# Patient Record
Sex: Male | Born: 1970
Health system: Southern US, Community
[De-identification: ages and names within clinical notes are randomized; demographics above are authoritative.]

## PROBLEM LIST (undated history)

## (undated) DIAGNOSIS — Z87442 Personal history of urinary calculi: Secondary | ICD-10-CM

## (undated) DIAGNOSIS — E785 Hyperlipidemia, unspecified: Secondary | ICD-10-CM

## (undated) DIAGNOSIS — T7840XA Allergy, unspecified, initial encounter: Secondary | ICD-10-CM

## (undated) HISTORY — DX: Allergy, unspecified, initial encounter: T78.40XA

## (undated) HISTORY — PX: LITHOTRIPSY: SUR834

## (undated) HISTORY — DX: Hyperlipidemia, unspecified: E78.5

---

## 2012-12-03 ENCOUNTER — Ambulatory Visit (INDEPENDENT_AMBULATORY_CARE_PROVIDER_SITE_OTHER): Payer: BC Managed Care – PPO | Admitting: Family Medicine

## 2012-12-03 ENCOUNTER — Encounter: Payer: Self-pay | Admitting: Family Medicine

## 2012-12-03 VITALS — BP 127/81 | HR 64 | Temp 97.2°F | Ht 72.0 in | Wt 175.2 lb

## 2012-12-03 DIAGNOSIS — E785 Hyperlipidemia, unspecified: Secondary | ICD-10-CM

## 2012-12-03 DIAGNOSIS — J302 Other seasonal allergic rhinitis: Secondary | ICD-10-CM

## 2012-12-03 DIAGNOSIS — J309 Allergic rhinitis, unspecified: Secondary | ICD-10-CM

## 2012-12-03 DIAGNOSIS — Z23 Encounter for immunization: Secondary | ICD-10-CM

## 2012-12-03 MED ORDER — FLUTICASONE PROPIONATE 50 MCG/ACT NA SUSP: 2.0000 | NASAL | Status: DC | PRN

## 2012-12-03 MED ORDER — PRAVASTATIN SODIUM 40 MG PO TABS: 40.0000 mg | ORAL_TABLET | Freq: Every day | ORAL | Status: DC

## 2012-12-03 NOTE — Progress Notes (Signed)
Patient ID: Jose Golden, male   DOB: 1970/08/17, 42 y.o.   MRN: [id number] SUBJECTIVE: CC: Chief Complaint  Patient presents with  . Follow-up    6 month follow up  refill mail order print     HPI: Patient is here for follow up of hyperlipidemia: denies Headache;denies Chest Pain;denies weakness;denies Shortness of Breath and orthopnea;denies Visual changes;denies palpitations;denies cough;denies pedal edema;denies symptoms of TIA or stroke;deniesClaudication symptoms. admits to Compliance with medications; denies Problems with medications.  Breakfast: pop tarts, cereal bars, occasional bacon Lunch: sandwiches: bologna or ham, sometimes frozen meals Dinner: Fast food, occasional wife cooks. Loves french fries, occasional steak  Exercise: not a lot , mows lawn, works on [employer], broadcasting  Past Medical History  Diagnosis Date  . Allergy   . Hyperlipidemia    History reviewed. No pertinent past surgical history. History   Social History  . Marital Status: Married    Spouse Name: N/A    Number of Children: N/A  . Years of Education: N/A   Occupational History  . Not on file.   Social History Main Topics  . Smoking status: Never Smoker   . Smokeless tobacco: Not on file  . Alcohol Use: No  . Drug Use: No  . Sexually Active: Yes   Other Topics Concern  . Not on file   Social History Narrative  . No narrative on file   Family History  Problem Relation Age of Onset  . Heart disease Mother   . Cancer Mother     kidney  . Heart disease Father     CABG  . Seizures Father    No current outpatient prescriptions on file prior to visit.   No current facility-administered medications on file prior to visit.   No Known Allergies Immunization History  Administered Date(s) Administered  . Tdap 12/03/2012   Prior to Admission medications   Medication Sig Start Date End Date Taking? Authorizing Provider  cetirizine (ZYRTEC) 10 MG tablet Take 10 mg by mouth daily.   Yes  Historical Provider, MD  fluticasone (FLONASE) 50 MCG/ACT nasal spray Place 2 sprays into the nose as needed for rhinitis.   Yes Historical Provider, MD  pravastatin (PRAVACHOL) 40 MG tablet Take 40 mg by mouth daily.   Yes Historical Provider, MD    ROS: As above in the HPI. All other systems are stable or negative.  OBJECTIVE: APPEARANCE:  Patient in no acute distress.The patient appeared well nourished and normally developed. Acyanotic. Waist: VITAL SIGNS:BP 127/81  Pulse 64  Temp(Src) 97.2 F (36.2 C) (Oral)  Ht 6' (1.829 m)  Wt 175 lb 3.2 oz (79.47 kg)  BMI 23.76 kg/m2 WM  SKIN: warm and  Dry without overt rashes, tattoos and scars  HEAD and Neck: without JVD, Head and scalp: normal Eyes:No scleral icterus. Fundi normal, eye movements normal. Ears: Auricle normal, canal normal, Tympanic membranes normal, insufflation normal. Nose: normal Throat: normal Neck & thyroid: normal  CHEST & LUNGS: Chest wall: normal Lungs: Clear  CVS: Reveals the PMI to be normally located. Regular rhythm, First and Second Heart sounds are normal,  absence of murmurs, rubs or gallops. Peripheral vasculature: Radial pulses: normal Dorsal pedis pulses: normal Posterior pulses: normal  ABDOMEN:  Appearance: normal Benign, no organomegaly, no masses, no Abdominal Aortic enlargement. No Guarding , no rebound. No Bruits. Bowel sounds: normal  RECTAL: N/A GU: N/A  EXTREMETIES: nonedematous. Both Femoral and Pedal pulses are normal.  MUSCULOSKELETAL:  Spine: normal Joints: intact  NEUROLOGIC: oriented to time,place and person; nonfocal. Strength is normal Sensory is normal Reflexes are normal Cranial Nerves are normal.  ASSESSMENT: HLD (hyperlipidemia) - Plan: pravastatin (PRAVACHOL) 40 MG tablet, CMP14+EGFR, NMR, lipoprofile  Need for Tdap vaccination - Plan: Tdap vaccine greater than or equal to 7yo IM  Seasonal allergic rhinitis - Plan: fluticasone (FLONASE) 50 MCG/ACT  nasal spray   PLAN:      Dr Woodroe Mode Recommendations  Diet and Exercise discussed with patient.  For nutrition information, I recommend books:  1).Eat to Live by Dr Monico Hoar. 2).Prevent and Reverse Heart Disease by Dr Suzzette Righter. 3) Dr Katherina Right Book:  Program to Reverse Diabetes 4) The [place] Study by Florene Route  Exercise recommendations are:  If unable to walk, then the patient can exercise in a chair 3 times a day. By flapping arms like a bird gently and raising legs outwards to the front.  If ambulatory, the patient can go for walks for 30 minutes 3 times a week. Then increase the intensity and duration as tolerated.  Goal is to try to attain exercise frequency to 5 times a week.  If applicable: Best to perform resistance exercises (machines or weights) 2 days a week and cardio type exercises 3 days per week.  Discussed lifestyle changes.  Orders Placed This Encounter  Procedures  . Tdap vaccine greater than or equal to 7yo IM  . CMP14+EGFR  . NMR, lipoprofile    Meds ordered this encounter  Medications  . DISCONTD: pravastatin (PRAVACHOL) 40 MG tablet    Sig: Take 40 mg by mouth daily.  Marland Kitchen DISCONTD: fluticasone (FLONASE) 50 MCG/ACT nasal spray    Sig: Place 2 sprays into the nose as needed for rhinitis.  Marland Kitchen cetirizine (ZYRTEC) 10 MG tablet    Sig: Take 10 mg by mouth daily.  . pravastatin (PRAVACHOL) 40 MG tablet    Sig: Take 1 tablet (40 mg total) by mouth daily.    Dispense:  30 tablet    Refill:  11  . fluticasone (FLONASE) 50 MCG/ACT nasal spray    Sig: Place 2 sprays into the nose as needed for rhinitis.    Dispense:  16 g    Refill:  5   Return in about 6 months (around 06/05/2013) for Recheck medical problems.  Chavonne Sforza P. Modesto Charon, M.D.

## 2012-12-03 NOTE — Progress Notes (Signed)
Tolerated tdap injection well without difficulty.

## 2012-12-03 NOTE — Patient Instructions (Addendum)
Tetanus, Diphtheria, Pertussis (Tdap) Vaccine What You Need to Know WHY GET VACCINATED? Tetanus, diphtheria and pertussis can be very serious diseases, even for adolescents and adults. Tdap vaccine can protect us from these diseases. TETANUS (Lockjaw) causes painful muscle tightening and stiffness, usually all over the body.  It can lead to tightening of muscles in the head and neck so you can't open your mouth, swallow, or sometimes even breathe. Tetanus kills about 1 out of 5 people who are infected. DIPHTHERIA can cause a thick coating to form in the back of the throat.  It can lead to breathing problems, paralysis, heart failure, and death. PERTUSSIS (Whooping Cough) causes severe coughing spells, which can cause difficulty breathing, vomiting and disturbed sleep.  It can also lead to weight loss, incontinence, and rib fractures. Up to 2 in 100 adolescents and 5 in 100 adults with pertussis are hospitalized or have complications, which could include pneumonia and death. These diseases are caused by bacteria. Diphtheria and pertussis are spread from person to person through coughing or sneezing. Tetanus enters the body through cuts, scratches, or wounds. Before vaccines, the [place] saw as many as 200,000 cases a year of diphtheria and pertussis, and hundreds of cases of tetanus. Since vaccination began, tetanus and diphtheria have dropped by about 99% and pertussis by about 80%. TDAP VACCINE Tdap vaccine can protect adolescents and adults from tetanus, diphtheria, and pertussis. One dose of Tdap is routinely given at age 11 or 12. People who did not get Tdap at that age should get it as soon as possible. Tdap is especially important for health care professionals and anyone having close contact with a baby younger than 12 months. Pregnant women should get a dose of Tdap during every pregnancy, to protect the newborn from pertussis. Infants are most at risk for severe, life-threatening  complications from pertussis. A similar vaccine, called Td, protects from tetanus and diphtheria, but not pertussis. A Td booster should be given every 10 years. Tdap may be given as one of these boosters if you have not already gotten a dose. Tdap may also be given after a severe cut or burn to prevent tetanus infection. Your doctor can give you more information. Tdap may safely be given at the same time as other vaccines. SOME PEOPLE SHOULD NOT GET THIS VACCINE  If you ever had a life-threatening allergic reaction after a dose of any tetanus, diphtheria, or pertussis containing vaccine, OR if you have a severe allergy to any part of this vaccine, you should not get Tdap. Tell your doctor if you have any severe allergies.  If you had a coma, or long or multiple seizures within 7 days after a childhood dose of DTP or DTaP, you should not get Tdap, unless a cause other than the vaccine was found. You can still get Td.  Talk to your doctor if you:  have epilepsy or another nervous system problem,  had severe pain or swelling after any vaccine containing diphtheria, tetanus or pertussis,  ever had Guillain-Barr Syndrome (GBS),  aren't feeling well on the day the shot is scheduled. RISKS OF A VACCINE REACTION With any medicine, including vaccines, there is a chance of side effects. These are usually mild and go away on their own, but serious reactions are also possible. Brief fainting spells can follow a vaccination, leading to injuries from falling. Sitting or lying down for about 15 minutes can help prevent these. Tell your doctor if you feel dizzy or light-headed, or   have vision changes or ringing in the ears. Mild problems following Tdap (Did not interfere with activities)  Pain where the shot was given (about 3 in 4 adolescents or 2 in 3 adults)  Redness or swelling where the shot was given (about 1 person in 5)  Mild fever of at least 100.34F (up to about 1 in 25 adolescents or 1 in  100 adults)  Headache (about 3 or 4 people in 10)  Tiredness (about 1 person in 3 or 4)  Nausea, vomiting, diarrhea, stomach ache (up to 1 in 4 adolescents or 1 in 10 adults)  Chills, body aches, sore joints, rash, swollen glands (uncommon) Moderate problems following Tdap (Interfered with activities, but did not require medical attention)  Pain where the shot was given (about 1 in 5 adolescents or 1 in 100 adults)  Redness or swelling where the shot was given (up to about 1 in 16 adolescents or 1 in 25 adults)  Fever over 102F (about 1 in 100 adolescents or 1 in 250 adults)  Headache (about 3 in 20 adolescents or 1 in 10 adults)  Nausea, vomiting, diarrhea, stomach ache (up to 1 or 3 people in 100)  Swelling of the entire arm where the shot was given (up to about 3 in 100). Severe problems following Tdap (Unable to perform usual activities, required medical attention)  Swelling, severe pain, bleeding and redness in the arm where the shot was given (rare). A severe allergic reaction could occur after any vaccine (estimated less than 1 in a million doses). WHAT IF THERE IS A SERIOUS REACTION? What should I look for?  Look for anything that concerns you, such as signs of a severe allergic reaction, very high fever, or behavior changes. Signs of a severe allergic reaction can include hives, swelling of the face and throat, difficulty breathing, a fast heartbeat, dizziness, and weakness. These would start a few minutes to a few hours after the vaccination. What should I do?  If you think it is a severe allergic reaction or other emergency that can't wait, call 9-1-1 or get the person to the nearest hospital. Otherwise, call your doctor.  Afterward, the reaction should be reported to the "Vaccine Adverse Event Reporting System" (VAERS). Your doctor might file this report, or you can do it yourself through the VAERS web site at www.vaers.LAgents.no, or by calling 1-(561)626-9151. VAERS is  only for reporting reactions. They do not give medical advice.  THE NATIONAL VACCINE INJURY COMPENSATION PROGRAM The National Vaccine Injury Compensation Program (VICP) is a federal program that was created to compensate people who may have been injured by certain vaccines. Persons who believe they may have been injured by a vaccine can learn about the program and about filing a claim by calling 1-661-229-6440 or visiting the VICP website at SpiritualWord.at. HOW CAN I LEARN MORE?  Ask your doctor.  Call your local or state health department.  Contact the Centers for Disease Control and Prevention (CDC):  Call 604 738 1426 or visit CDC's website at PicCapture.uy. CDC Tdap Vaccine VIS (09/14/11) Document Released: 10/24/2011 Document Revised: 01/17/2012 Document Reviewed: 10/24/2011 ExitCare Patient Information 2014 [place], [place].        Dr Woodroe Mode Recommendations  Diet and Exercise discussed with patient.  For nutrition information, I recommend books:  1).Eat to Live by Dr Monico Hoar. 2).Prevent and Reverse Heart Disease by Dr Suzzette Righter. 3) Dr Katherina Right Book:  Program to Reverse Diabetes 4) The [place]  Study by T Ferol Luz  Exercise recommendations are:  If unable to walk, then the patient can exercise in a chair 3 times a day. By flapping arms like a bird gently and raising legs outwards to the front.  If ambulatory, the patient can go for walks for 30 minutes 3 times a week. Then increase the intensity and duration as tolerated.  Goal is to try to attain exercise frequency to 5 times a week.  If applicable: Best to perform resistance exercises (machines or weights) 2 days a week and cardio type exercises 3 days per week.

## 2012-12-04 LAB — CMP14+EGFR
ALT: 22 IU/L (ref 0–44)
AST: 25 IU/L (ref 0–40)
Albumin/Globulin Ratio: 2 (ref 1.1–2.5)
Albumin: 4.5 g/dL (ref 3.5–5.5)
Alkaline Phosphatase: 51 IU/L (ref 39–117)
BUN/Creatinine Ratio: 11 (ref 9–20)
BUN: 10 mg/dL (ref 6–24)
CO2: 23 mmol/L (ref 18–29)
Calcium: 9.5 mg/dL (ref 8.7–10.2)
Chloride: 105 mmol/L (ref 97–108)
Creatinine, Ser: 0.92 mg/dL (ref 0.76–1.27)
GFR calc Af Amer: 117 mL/min/{1.73_m2} (ref >59)
GFR calc non Af Amer: 102 mL/min/{1.73_m2} (ref >59)
Globulin, Total: 2.3 g/dL (ref 1.5–4.5)
Glucose: 88 mg/dL (ref 65–99)
Potassium: 4.7 mmol/L (ref 3.5–5.2)
Sodium: 142 mmol/L (ref 134–144)
Total Bilirubin: 1.2 mg/dL (ref 0.0–1.2)
Total Protein: 6.8 g/dL (ref 6.0–8.5)

## 2012-12-04 LAB — NMR, LIPOPROFILE
Cholesterol: 131 mg/dL (ref <200)
HDL Cholesterol by NMR: 51 mg/dL (ref >=40)
HDL Particle Number: 35.5 umol/L (ref >=30.5)
LDL Particle Number: 787 nmol/L (ref <1000)
LDL Size: 20.6 nm (ref >20.5)
LDLC SERPL CALC-MCNC: 69 mg/dL (ref <100)
LP-IR Score: 38 (ref <=45)
Small LDL Particle Number: 437 nmol/L (ref <=527)
Triglycerides by NMR: 57 mg/dL (ref <150)

## 2012-12-04 NOTE — Progress Notes (Signed)
Quick Note:  Lab result at goal. No change in Medications for now. No Change in plans and follow up. ______ 

## 2012-12-12 ENCOUNTER — Encounter: Payer: Self-pay | Admitting: *Deleted

## 2013-01-21 ENCOUNTER — Other Ambulatory Visit: Payer: Self-pay

## 2013-01-21 DIAGNOSIS — E785 Hyperlipidemia, unspecified: Secondary | ICD-10-CM

## 2013-01-21 MED ORDER — PRAVASTATIN SODIUM 40 MG PO TABS: 40.0000 mg | ORAL_TABLET | Freq: Every day | ORAL | Status: DC

## 2013-01-21 NOTE — Telephone Encounter (Signed)
Filled 12/03/12 for 11 refills but wants for mail order now   If approved print and route to nurse

## 2013-06-05 ENCOUNTER — Encounter: Payer: Self-pay | Admitting: Family Medicine

## 2013-06-05 ENCOUNTER — Ambulatory Visit (INDEPENDENT_AMBULATORY_CARE_PROVIDER_SITE_OTHER): Payer: BC Managed Care – PPO | Admitting: Family Medicine

## 2013-06-05 VITALS — BP 126/82 | HR 73 | Temp 97.3°F | Ht 72.0 in | Wt 177.4 lb

## 2013-06-05 DIAGNOSIS — J302 Other seasonal allergic rhinitis: Secondary | ICD-10-CM

## 2013-06-05 DIAGNOSIS — E785 Hyperlipidemia, unspecified: Secondary | ICD-10-CM

## 2013-06-05 DIAGNOSIS — J309 Allergic rhinitis, unspecified: Secondary | ICD-10-CM

## 2013-06-05 MED ORDER — PRAVASTATIN SODIUM 40 MG PO TABS: 40.0000 mg | ORAL_TABLET | Freq: Every day | ORAL | Status: DC

## 2013-06-05 NOTE — Patient Instructions (Signed)
      Dr Junette Bernat's Recommendations  For nutrition information, I recommend books:  1).Eat to Live by Dr Joel Fuhrman. 2).Prevent and Reverse Heart Disease by Dr Caldwell Esselstyn. 3) Dr Neal Barnard's Book:  Program to Reverse Diabetes  Exercise recommendations are:  If unable to walk, then the patient can exercise in a chair 3 times a day. By flapping arms like a bird gently and raising legs outwards to the front.  If ambulatory, the patient can go for walks for 30 minutes 3 times a week. Then increase the intensity and duration as tolerated.  Goal is to try to attain exercise frequency to 5 times a week.  If applicable: Best to perform resistance exercises (machines or weights) 2 days a week and cardio type exercises 3 days per week.  

## 2013-06-05 NOTE — Progress Notes (Signed)
Patient ID: RICHARDS PHERIGO, male   DOB: [birth year], 43 y.o.   MRN: [id number] SUBJECTIVE: CC: Chief Complaint  Patient presents with  . Follow-up    6 month follow up chronic problems     HPI: Patient is here for follow up of hyperlipidemia: denies Headache;denies Chest Pain;denies weakness;denies Shortness of Breath and orthopnea;denies Visual changes;denies palpitations;denies cough;denies pedal edema;denies symptoms of TIA or stroke;deniesClaudication symptoms. admits to Compliance with medications; denies Problems with medications.   Past Medical History  Diagnosis Date  . Allergy   . Hyperlipidemia    No past surgical history on file. History   Social History  . Marital Status: Married    Spouse Name: N/A    Number of Children: N/A  . Years of Education: N/A   Occupational History  . Not on file.   Social History Main Topics  . Smoking status: Never Smoker   . Smokeless tobacco: Not on file  . Alcohol Use: No  . Drug Use: No  . Sexual Activity: Yes   Other Topics Concern  . Not on file   Social History Narrative  . No narrative on file   Family History  Problem Relation Age of Onset  . Heart disease Mother   . Cancer Mother     kidney  . Heart disease Father     CABG  . Seizures Father    Current Outpatient Prescriptions on File Prior to Visit  Medication Sig Dispense Refill  . cetirizine (ZYRTEC) 10 MG tablet Take 10 mg by mouth daily.      . fluticasone (FLONASE) 50 MCG/ACT nasal spray Place 2 sprays into the nose as needed for rhinitis.  16 g  5   No current facility-administered medications on file prior to visit.   No Known Allergies Immunization History  Administered Date(s) Administered  . Influenza,inj,Quad PF,36+ Mos 03/05/2013  . Tdap 12/03/2012   Prior to Admission medications   Medication Sig Start Date End Date Taking? Authorizing Provider  cetirizine (ZYRTEC) 10 MG tablet Take 10 mg by mouth daily.    Historical Provider, MD   fluticasone (FLONASE) 50 MCG/ACT nasal spray Place 2 sprays into the nose as needed for rhinitis. 12/03/12   Vernie Shanks, MD  pravastatin (PRAVACHOL) 40 MG tablet Take 1 tablet (40 mg total) by mouth daily. 01/21/13   Mary-Margaret Hassell Done, FNP     ROS: As above in the HPI. All other systems are stable or negative.  OBJECTIVE: APPEARANCE:  Patient in no acute distress.The patient appeared well nourished and normally developed. Acyanotic. Waist: VITAL SIGNS:BP 126/82  Pulse 73  Temp(Src) 97.3 F (36.3 C) (Oral)  Ht 6' (1.829 m)  Wt 177 lb 6.4 oz (80.468 kg)  BMI 24.05 kg/m2 WM  SKIN: warm and  Dry without overt rashes, tattoos and scars  HEAD and Neck: without JVD, Head and scalp: normal Eyes:No scleral icterus. Fundi normal, eye movements normal. Ears: Auricle normal, canal normal, Tympanic membranes normal, insufflation normal. Nose: normal Throat: normal Neck & thyroid: normal  CHEST & LUNGS: Chest wall: normal Lungs: Clear  CVS: Reveals the PMI to be normally located. Regular rhythm, First and Second Heart sounds are normal,  absence of murmurs, rubs or gallops. Peripheral vasculature: Radial pulses: normal Dorsal pedis pulses: normal Posterior pulses: normal  ABDOMEN:  Appearance: normal Benign, no organomegaly, no masses, no Abdominal Aortic enlargement. No Guarding , no rebound. No Bruits. Bowel sounds: normal  RECTAL: N/A GU: N/A  EXTREMETIES: nonedematous.  MUSCULOSKELETAL:  Spine: normal Joints: intact  NEUROLOGIC: oriented to time,place and person; nonfocal. Strength is normal Sensory is normal Reflexes are normal Cranial Nerves are normal. Results for orders placed in visit on 12/03/12  CMP14+EGFR      Result Value Range   Glucose 88  65 - 99 mg/dL   BUN 10  6 - 24 mg/dL   Creatinine, Ser 0.92  0.76 - 1.27 mg/dL   GFR calc non Af Amer 102  >59 mL/min/1.73   GFR calc Af Amer 117  >59 mL/min/1.73   BUN/Creatinine Ratio 11  9 - 20    Sodium 142  134 - 144 mmol/L   Potassium 4.7  3.5 - 5.2 mmol/L   Chloride 105  97 - 108 mmol/L   CO2 23  18 - 29 mmol/L   Calcium 9.5  8.7 - 10.2 mg/dL   Total Protein 6.8  6.0 - 8.5 g/dL   Albumin 4.5  3.5 - 5.5 g/dL   Globulin, Total 2.3  1.5 - 4.5 g/dL   Albumin/Globulin Ratio 2.0  1.1 - 2.5   Total Bilirubin 1.2  0.0 - 1.2 mg/dL   Alkaline Phosphatase 51  39 - 117 IU/L   AST 25  0 - 40 IU/L   ALT 22  0 - 44 IU/L  NMR, LIPOPROFILE      Result Value Range   LDL Particle Number [id number]  <1000 nmol/L   LDLC SERPL CALC-MCNC 69  <100 mg/dL   HDL Cholesterol by NMR 51  >=40 mg/dL   Triglycerides by NMR 57  <150 mg/dL   Cholesterol 131  <200 mg/dL   HDL Particle Number 35.5  >=30.5 umol/L   Small LDL Particle Number 437  <=527 nmol/L   LDL Size 20.6  >20.5 nm   LP-IR Score 38  <=45    ASSESSMENT: Seasonal allergic rhinitis  HLD (hyperlipidemia) - Plan: CMP14+EGFR, Lipid panel, pravastatin (PRAVACHOL) 40 MG tablet  PLAN:      Dr Paula Libra Recommendations  For nutrition information, I recommend books:  1).Eat to Live by Dr Excell Seltzer. 2).Prevent and Reverse Heart Disease by Dr Karl Luke. 3) Dr Janene Harvey Book:  Program to Reverse Diabetes  Exercise recommendations are:  If unable to walk, then the patient can exercise in a chair 3 times a day. By flapping arms like a bird gently and raising legs outwards to the front.  If ambulatory, the patient can go for walks for 30 minutes 3 times a week. Then increase the intensity and duration as tolerated.  Goal is to try to attain exercise frequency to 5 times a week.  If applicable: Best to perform resistance exercises (machines or weights) 2 days a week and cardio type exercises 3 days per week.  Orders Placed This Encounter  Procedures  . CMP14+EGFR  . Lipid panel   Meds ordered this encounter  Medications  . pravastatin (PRAVACHOL) 40 MG tablet    Sig: Take 1 tablet (40 mg total) by mouth daily.     Dispense:  90 tablet    Refill:  3   Medications Discontinued During This Encounter  Medication Reason  . pravastatin (PRAVACHOL) 40 MG tablet Reorder   Return in about 6 months (around 12/03/2013) for Recheck medical problems.  Marikay Roads P. Jacelyn Grip, M.D.

## 2013-06-06 LAB — CMP14+EGFR
ALT: 33 IU/L (ref 0–44)
AST: 29 IU/L (ref 0–40)
Albumin/Globulin Ratio: 2.2 (ref 1.1–2.5)
Albumin: 4.7 g/dL (ref 3.5–5.5)
Alkaline Phosphatase: 59 IU/L (ref 39–117)
BUN/Creatinine Ratio: 11 (ref 9–20)
BUN: 12 mg/dL (ref 6–24)
CO2: 24 mmol/L (ref 18–29)
Calcium: 9.5 mg/dL (ref 8.7–10.2)
Chloride: 102 mmol/L (ref 97–108)
Creatinine, Ser: 1.08 mg/dL (ref 0.76–1.27)
GFR calc Af Amer: 97 mL/min/{1.73_m2} (ref >59)
GFR calc non Af Amer: 84 mL/min/{1.73_m2} (ref >59)
Globulin, Total: 2.1 g/dL (ref 1.5–4.5)
Glucose: 83 mg/dL (ref 65–99)
Potassium: 4.7 mmol/L (ref 3.5–5.2)
Sodium: 142 mmol/L (ref 134–144)
Total Bilirubin: 1.9 mg/dL — ABNORMAL HIGH (ref 0.0–1.2)
Total Protein: 6.8 g/dL (ref 6.0–8.5)

## 2013-06-06 LAB — LIPID PANEL
Chol/HDL Ratio: 2.5 ratio units (ref 0.0–5.0)
Cholesterol, Total: 147 mg/dL (ref 100–199)
HDL: 60 mg/dL (ref >39)
LDL Calculated: 75 mg/dL (ref 0–99)
Triglycerides: 59 mg/dL (ref 0–149)
VLDL Cholesterol Cal: 12 mg/dL (ref 5–40)

## 2013-07-01 ENCOUNTER — Telehealth: Payer: Self-pay | Admitting: Family Medicine

## 2013-07-01 ENCOUNTER — Ambulatory Visit (INDEPENDENT_AMBULATORY_CARE_PROVIDER_SITE_OTHER): Payer: BC Managed Care – PPO | Admitting: Family Medicine

## 2013-07-01 ENCOUNTER — Encounter: Payer: Self-pay | Admitting: Family Medicine

## 2013-07-01 ENCOUNTER — Encounter: Payer: Self-pay | Admitting: *Deleted

## 2013-07-01 VITALS — BP 144/86 | HR 110 | Temp 99.9°F | Ht 72.0 in | Wt 180.0 lb

## 2013-07-01 DIAGNOSIS — R059 Cough, unspecified: Secondary | ICD-10-CM

## 2013-07-01 DIAGNOSIS — J029 Acute pharyngitis, unspecified: Secondary | ICD-10-CM

## 2013-07-01 DIAGNOSIS — J111 Influenza due to unidentified influenza virus with other respiratory manifestations: Secondary | ICD-10-CM

## 2013-07-01 DIAGNOSIS — R6889 Other general symptoms and signs: Secondary | ICD-10-CM

## 2013-07-01 DIAGNOSIS — R05 Cough: Secondary | ICD-10-CM

## 2013-07-01 LAB — POCT INFLUENZA A/B
Influenza A, POC: POSITIVE
Influenza B, POC: NEGATIVE

## 2013-07-01 LAB — POCT RAPID STREP A (OFFICE): Rapid Strep A Screen: NEGATIVE

## 2013-07-01 MED ORDER — AZITHROMYCIN 250 MG PO TABS: ORAL_TABLET | ORAL | Status: DC

## 2013-07-01 MED ORDER — OSELTAMIVIR PHOSPHATE 75 MG PO CAPS: 75.0000 mg | ORAL_CAPSULE | Freq: Two times a day (BID) | ORAL | Status: DC

## 2013-07-01 NOTE — Telephone Encounter (Signed)
Patient wife aware

## 2013-07-01 NOTE — Progress Notes (Signed)
   Subjective:    Patient ID: Jose Golden, male    DOB: 06/10/1970, 43 y.o.   MRN: [id number]  HPI This 43 y.o. male presents for evaluation of fever, uri sx's, fever, malaise, sore throat, and fatigue.  Review of Systems    No chest pain, SOB, HA, dizziness, vision change, N/V, diarrhea, constipation, dysuria, urinary urgency or frequency, myalgias, arthralgias or rash.  Objective:   Physical Exam  Vital signs noted  Well developed well nourished male.  HEENT - Head atraumatic Normocephalic                Eyes - PERRLA, Conjuctiva - clear Sclera- Clear EOMI                Ears - EAC's Wnl TM's Wnl Gross Hearing WNL                Nose - Nares patent                 Throat - oropharanx wnl Respiratory - Lungs CTA bilateral Cardiac - RRR S1 and S2 without murmur GI - Abdomen soft Nontender and bowel sounds active x 4 Extremities - No edema. Neuro - Grossly intact.      Assessment & Plan:  Flu-like symptoms - Plan: POCT Influenza A/B, POCT rapid strep A, oseltamivir (TAMIFLU) 75 MG capsule, azithromycin (ZITHROMAX) 250 MG tablet  Flu - Plan: oseltamivir (TAMIFLU) 75 MG capsule, azithromycin (ZITHROMAX) 250 MG tablet  Push po fluids, rest, tylenol and motrin otc prn as directed for fever, arthralgias, and myalgias.  Follow up prn if sx's continue or persist.  Deatra CanterWilliam J Martia Dalby FNP

## 2013-07-01 NOTE — Telephone Encounter (Signed)
Has the wife and daughter been seen here before?  It doesn't look like the daughter has been seen here

## 2013-10-03 ENCOUNTER — Other Ambulatory Visit: Payer: Self-pay | Admitting: Nurse Practitioner

## 2013-10-03 NOTE — Telephone Encounter (Signed)
Last seen 07/01/13 B [place] Last lipid 06/05/13  Requesting a 90 day supply

## 2013-12-12 ENCOUNTER — Other Ambulatory Visit: Payer: Self-pay | Admitting: Family Medicine

## 2014-03-12 ENCOUNTER — Other Ambulatory Visit: Payer: Self-pay | Admitting: Family Medicine

## 2016-07-20 ENCOUNTER — Encounter: Payer: Self-pay | Admitting: Family

## 2016-07-20 ENCOUNTER — Ambulatory Visit (INDEPENDENT_AMBULATORY_CARE_PROVIDER_SITE_OTHER): Payer: BLUE CROSS/BLUE SHIELD | Admitting: Family

## 2016-07-20 VITALS — BP 145/89 | HR 90 | Temp 98.1°F | Ht 72.0 in | Wt 187.4 lb

## 2016-07-20 DIAGNOSIS — R21 Rash and other nonspecific skin eruption: Secondary | ICD-10-CM | POA: Diagnosis not present

## 2016-07-20 DIAGNOSIS — B09 Unspecified viral infection characterized by skin and mucous membrane lesions: Secondary | ICD-10-CM | POA: Diagnosis not present

## 2016-07-20 NOTE — Patient Instructions (Signed)
Viral Illness, Adult Viruses are tiny germs that can get into a person's body and cause illness. There are many different types of viruses, and they cause many types of illness. Viral illnesses can range from mild to severe. They can affect various parts of the body. Common illnesses that are caused by a virus include colds and the flu. Viral illnesses also include serious conditions such as HIV/AIDS (human immunodeficiency virus/acquired immunodeficiency syndrome). A few viruses have been linked to certain cancers. What are the causes? Many types of viruses can cause illness. Viruses invade cells in your body, multiply, and cause the infected cells to malfunction or die. When the cell dies, it releases more of the virus. When this happens, you develop symptoms of the illness, and the virus continues to spread to other cells. If the virus takes over the function of the cell, it can cause the cell to divide and grow out of control, as is the case when a virus causes cancer. Different viruses get into the body in different ways. You can get a virus by:  Swallowing food or water that is contaminated with the virus.  Breathing in droplets that have been coughed or sneezed into the air by an infected person.  Touching a surface that has been contaminated with the virus and then touching your eyes, nose, or mouth.  Being bitten by an insect or animal that carries the virus.  Having sexual contact with a person who is infected with the virus.  Being exposed to blood or fluids that contain the virus, either through an open cut or during a transfusion. If a virus enters your body, your body's defense system (immune system) will try to fight the virus. You may be at higher risk for a viral illness if your immune system is weak. What are the signs or symptoms? Symptoms vary depending on the type of virus and the location of the cells that it invades. Common symptoms of the main types of viral illnesses  include: Cold and flu viruses   Fever.  Headache.  Sore throat.  Muscle aches.  Nasal congestion.  Cough. Digestive system (gastrointestinal) viruses   Fever.  Abdominal pain.  Nausea.  Diarrhea. Liver viruses (hepatitis)   Loss of appetite.  Tiredness.  Yellowing of the skin (jaundice). Brain and spinal cord viruses   Fever.  Headache.  Stiff neck.  Nausea and vomiting.  Confusion or sleepiness. Skin viruses   Warts.  Itching.  Rash. Sexually transmitted viruses   Discharge.  Swelling.  Redness.  Rash. How is this treated? Viruses can be difficult to treat because they live within cells. Antibiotic medicines do not treat viruses because these drugs do not get inside cells. Treatment for a viral illness may include:  Resting and drinking plenty of fluids.  Medicines to relieve symptoms. These can include over-the-counter medicine for pain and fever, medicines for cough or congestion, and medicines to relieve diarrhea.  Antiviral medicines. These drugs are available only for certain types of viruses. They may help reduce flu symptoms if taken early. There are also many antiviral medicines for hepatitis and HIV/AIDS. Some viral illnesses can be prevented with vaccinations. A common example is the flu shot. Follow these instructions at home: Medicines    Take over-the-counter and prescription medicines only as told by your health care provider.  If you were prescribed an antiviral medicine, take it as told by your health care provider. Do not stop taking the medicine even if you start to   feel better.  Be aware of when antibiotics are needed and when they are not needed. Antibiotics do not treat viruses. If your health care provider thinks that you may have a bacterial infection as well as a viral infection, you may get an antibiotic.  Do not ask for an antibiotic prescription if you have been diagnosed with a viral illness. That will not make  your illness go away faster.  Frequently taking antibiotics when they are not needed can lead to antibiotic resistance. When this develops, the medicine no longer works against the bacteria that it normally fights. General instructions   Drink enough fluids to keep your urine clear or pale yellow.  Rest as much as possible.  Return to your normal activities as told by your health care provider. Ask your health care provider what activities are safe for you.  Keep all follow-up visits as told by your health care provider. This is important. How is this prevented? Take these actions to reduce your risk of viral infection:  Eat a healthy diet and get enough rest.  Wash your hands often with soap and water. This is especially important when you are in public places. If soap and water are not available, use hand sanitizer.  Avoid close contact with friends and family who have a viral illness.  If you travel to areas where viral gastrointestinal infection is common, avoid drinking water or eating raw food.  Keep your immunizations up to date. Get a flu shot every year as told by your health care provider.  Do not share toothbrushes, nail clippers, razors, or needles with other people.  Always practice safe sex. Contact a health care provider if:  You have symptoms of a viral illness that do not go away.  Your symptoms come back after going away.  Your symptoms get worse. Get help right away if:  You have trouble breathing.  You have a severe headache or a stiff neck.  You have severe vomiting or abdominal pain. This information is not intended to replace advice given to you by your health care provider. Make sure you discuss any questions you have with your health care provider. Document Released: 09/03/2015 Document Revised: 10/06/2015 Document Reviewed: 09/03/2015 Elsevier Interactive Patient Education  2017 [employer]  

## 2016-07-20 NOTE — Progress Notes (Signed)
   Subjective:    Patient ID: Jose Golden, male    DOB: 12/28/1970, 46 y.o.   MRN: [id number]  Rash  This is a new problem. The current episode started yesterday. The problem is unchanged. The affected locations include the abdomen, back, chest, left arm and right arm. The rash is characterized by redness. It is unknown if there was an exposure to a precipitant. Pertinent negatives include no congestion, cough, diarrhea, joint pain, shortness of breath, sore throat or vomiting. Past treatments include antihistamine. The treatment provided mild relief.      Review of Systems  HENT: Negative for congestion and sore throat.   Respiratory: Negative for cough and shortness of breath.   Gastrointestinal: Negative for diarrhea and vomiting.  Musculoskeletal: Negative for joint pain.  Skin: Positive for rash.  All other systems reviewed and are negative.      Objective:   Physical Exam  Constitutional: He is oriented to person, place, and time. He appears well-developed and well-nourished. No distress.  HENT:  Head: Normocephalic.  Eyes: Pupils are equal, round, and reactive to light. Right eye exhibits no discharge. Left eye exhibits no discharge.  Neck: Normal range of motion. Neck supple. No thyromegaly present.  Cardiovascular: Normal rate, regular rhythm, normal heart sounds and intact distal pulses.   No murmur heard. Pulmonary/Chest: Effort normal and breath sounds normal. No respiratory distress. He has no wheezes.  Abdominal: Soft. Bowel sounds are normal. He exhibits no distension. There is no tenderness.  Musculoskeletal: Normal range of motion. He exhibits no edema or tenderness.  Neurological: He is alert and oriented to person, place, and time.  Skin: Skin is warm and dry. Rash noted. No erythema.  Generalized erythemas blanchable macule rash   Psychiatric: He has a normal mood and affect. His behavior is normal. Judgment and thought content normal.  Vitals  reviewed.     BP (!) 145/89   Pulse 90   Temp 98.1 F (36.7 C) (Oral)   Ht 6' (1.829 m)   Wt 187 lb 6.4 oz (85 kg)   BMI 25.42 kg/m      Assessment & Plan:  1. Rash  2. Viral rash  Force fluids Continue Zyrtec daily Benadryl as needed Call office if rash worsens RTO prn   Jannifer Rodneyhristy Eurydice Calixto, FNP

## 2016-08-21 DIAGNOSIS — E785 Hyperlipidemia, unspecified: Secondary | ICD-10-CM | POA: Diagnosis not present

## 2016-08-21 DIAGNOSIS — Z79899 Other long term (current) drug therapy: Secondary | ICD-10-CM | POA: Diagnosis not present

## 2016-08-21 DIAGNOSIS — E559 Vitamin D deficiency, unspecified: Secondary | ICD-10-CM | POA: Diagnosis not present

## 2016-08-21 DIAGNOSIS — Z008 Encounter for other general examination: Secondary | ICD-10-CM | POA: Diagnosis not present

## 2016-08-21 DIAGNOSIS — Z719 Counseling, unspecified: Secondary | ICD-10-CM | POA: Diagnosis not present

## 2016-08-28 DIAGNOSIS — E785 Hyperlipidemia, unspecified: Secondary | ICD-10-CM | POA: Diagnosis not present

## 2017-01-24 DIAGNOSIS — E785 Hyperlipidemia, unspecified: Secondary | ICD-10-CM | POA: Diagnosis not present

## 2017-01-24 DIAGNOSIS — E559 Vitamin D deficiency, unspecified: Secondary | ICD-10-CM | POA: Diagnosis not present

## 2017-01-24 DIAGNOSIS — Z79899 Other long term (current) drug therapy: Secondary | ICD-10-CM | POA: Diagnosis not present

## 2017-01-24 DIAGNOSIS — Z139 Encounter for screening, unspecified: Secondary | ICD-10-CM | POA: Diagnosis not present

## 2017-02-07 DIAGNOSIS — E785 Hyperlipidemia, unspecified: Secondary | ICD-10-CM | POA: Diagnosis not present

## 2017-04-26 DIAGNOSIS — L509 Urticaria, unspecified: Secondary | ICD-10-CM | POA: Diagnosis not present

## 2017-04-26 DIAGNOSIS — R21 Rash and other nonspecific skin eruption: Secondary | ICD-10-CM | POA: Diagnosis not present

## 2017-04-26 DIAGNOSIS — Z6824 Body mass index (BMI) 24.0-24.9, adult: Secondary | ICD-10-CM | POA: Diagnosis not present

## 2017-05-18 DIAGNOSIS — J329 Chronic sinusitis, unspecified: Secondary | ICD-10-CM | POA: Diagnosis not present

## 2017-08-08 DIAGNOSIS — Z79899 Other long term (current) drug therapy: Secondary | ICD-10-CM | POA: Diagnosis not present

## 2017-08-08 DIAGNOSIS — E559 Vitamin D deficiency, unspecified: Secondary | ICD-10-CM | POA: Diagnosis not present

## 2017-08-08 DIAGNOSIS — Z713 Dietary counseling and surveillance: Secondary | ICD-10-CM | POA: Diagnosis not present

## 2017-08-08 DIAGNOSIS — Z008 Encounter for other general examination: Secondary | ICD-10-CM | POA: Diagnosis not present

## 2017-08-08 DIAGNOSIS — E785 Hyperlipidemia, unspecified: Secondary | ICD-10-CM | POA: Diagnosis not present

## 2017-08-15 DIAGNOSIS — E785 Hyperlipidemia, unspecified: Secondary | ICD-10-CM | POA: Diagnosis not present

## 2018-01-21 ENCOUNTER — Encounter: Payer: Self-pay | Admitting: Family

## 2018-01-21 DIAGNOSIS — E559 Vitamin D deficiency, unspecified: Secondary | ICD-10-CM | POA: Diagnosis not present

## 2018-01-21 DIAGNOSIS — Z0130 Encounter for examination of blood pressure without abnormal findings: Secondary | ICD-10-CM | POA: Diagnosis not present

## 2018-01-21 DIAGNOSIS — Z79899 Other long term (current) drug therapy: Secondary | ICD-10-CM | POA: Diagnosis not present

## 2018-01-21 DIAGNOSIS — Z139 Encounter for screening, unspecified: Secondary | ICD-10-CM | POA: Diagnosis not present

## 2018-01-21 DIAGNOSIS — E785 Hyperlipidemia, unspecified: Secondary | ICD-10-CM | POA: Diagnosis not present

## 2018-01-31 DIAGNOSIS — Z23 Encounter for immunization: Secondary | ICD-10-CM | POA: Diagnosis not present

## 2018-02-04 DIAGNOSIS — R945 Abnormal results of liver function studies: Secondary | ICD-10-CM | POA: Diagnosis not present

## 2018-02-04 DIAGNOSIS — E785 Hyperlipidemia, unspecified: Secondary | ICD-10-CM | POA: Diagnosis not present

## 2018-03-14 ENCOUNTER — Ambulatory Visit (INDEPENDENT_AMBULATORY_CARE_PROVIDER_SITE_OTHER): Payer: BLUE CROSS/BLUE SHIELD

## 2018-03-14 ENCOUNTER — Ambulatory Visit (INDEPENDENT_AMBULATORY_CARE_PROVIDER_SITE_OTHER): Payer: BLUE CROSS/BLUE SHIELD | Admitting: Family

## 2018-03-14 ENCOUNTER — Encounter: Payer: Self-pay | Admitting: Family

## 2018-03-14 VITALS — BP 153/97 | HR 83 | Temp 97.3°F | Ht 72.0 in | Wt 191.0 lb

## 2018-03-14 DIAGNOSIS — Z0000 Encounter for general adult medical examination without abnormal findings: Secondary | ICD-10-CM | POA: Diagnosis not present

## 2018-03-14 DIAGNOSIS — K5900 Constipation, unspecified: Secondary | ICD-10-CM

## 2018-03-14 DIAGNOSIS — R1032 Left lower quadrant pain: Secondary | ICD-10-CM

## 2018-03-14 DIAGNOSIS — N200 Calculus of kidney: Secondary | ICD-10-CM | POA: Diagnosis not present

## 2018-03-14 DIAGNOSIS — E785 Hyperlipidemia, unspecified: Secondary | ICD-10-CM

## 2018-03-14 DIAGNOSIS — R030 Elevated blood-pressure reading, without diagnosis of hypertension: Secondary | ICD-10-CM

## 2018-03-14 DIAGNOSIS — E663 Overweight: Secondary | ICD-10-CM

## 2018-03-14 MED ORDER — POLYETHYLENE GLYCOL 3350 17 GM/SCOOP PO POWD: 17.0000 g | Freq: Two times a day (BID) | ORAL | 1 refills | Status: DC | PRN

## 2018-03-14 NOTE — Patient Instructions (Signed)

## 2018-03-14 NOTE — Progress Notes (Signed)
Subjective:    Patient ID: Jose Golden, male    DOB: 15-Aug-1970, 47 y.o.   MRN: [id number]  Chief Complaint  Patient presents with  . Annual Exam    pressure lower right abdomen   Pt presents to the office today for CPE. He had lab work drawn at [employer] on 01/21/18. A copy is scanned into his chart.  Hyperlipidemia  This is a chronic problem. The current episode started more than 1 year ago. The problem is controlled. Recent lipid tests were reviewed and are normal. Current antihyperlipidemic treatment includes statins. The current treatment provides moderate improvement of lipids. Risk factors for coronary artery disease include dyslipidemia, family history and male sex.  Abdominal Pain  This is a new problem. The current episode started more than 1 month ago. The onset quality is gradual. The problem occurs intermittently. The problem has been gradually worsening. The pain is located in the LLQ. The pain is at a severity of 1/10. The pain is mild. The quality of the pain is dull. The abdominal pain does not radiate. Associated symptoms include diarrhea. Pertinent negatives include no belching, constipation, dysuria, hematochezia, hematuria, nausea or vomiting. The pain is aggravated by certain positions. The pain is relieved by nothing. He has tried nothing for the symptoms. The treatment provided no relief.      Review of Systems  Gastrointestinal: Positive for abdominal pain and diarrhea. Negative for constipation, hematochezia, nausea and vomiting.  Genitourinary: Negative for dysuria and hematuria.  All other systems reviewed and are negative.      Objective:   Physical Exam  Constitutional: He is oriented to person, place, and time. He appears well-developed and well-nourished. No distress.  HENT:  Head: Normocephalic.  Right Ear: External ear normal.  Left Ear: External ear normal.  Mouth/Throat: Oropharynx is clear and moist.  Eyes: Pupils are equal, round, and reactive to  light. Right eye exhibits no discharge. Left eye exhibits no discharge.  Neck: Normal range of motion. Neck supple. No thyromegaly present.  Cardiovascular: Normal rate, regular rhythm, normal heart sounds and intact distal pulses.  No murmur heard. Pulmonary/Chest: Effort normal and breath sounds normal. No respiratory distress. He has no wheezes.  Abdominal: Soft. Bowel sounds are normal. He exhibits no distension. There is no tenderness (mild left lower tenderness).  Musculoskeletal: Normal range of motion. He exhibits no edema or tenderness.  Neurological: He is alert and oriented to person, place, and time. He has normal reflexes. No cranial nerve deficit.  Skin: Skin is warm and dry. No rash noted. No erythema.  Psychiatric: He has a normal mood and affect. His behavior is normal. Judgment and thought content normal.  Vitals reviewed.   BP (!) 144/92   Pulse 83   Temp (!) 97.3 F (36.3 C) (Oral)   Ht 6' (1.829 m)   Wt 191 lb (86.6 kg)   BMI 25.90 kg/m      Assessment & Plan:  Jose Golden comes in today with chief complaint of Annual Exam (pressure lower right abdomen)   Diagnosis and orders addressed:  1. Annual physical exam  2. Hyperlipidemia, unspecified hyperlipidemia type  3. Overweight (BMI 25.0-29.9)  4. Left lower quadrant abdominal pain - DG Abd 1 View; Future  5. Elevated blood pressure reading Pt states his BP at work 120/75  He will get NP at work to continue to monitor   6. Constipation, unspecified constipation type Force fluids Increase fiber  - polyethylene glycol powder (GLYCOLAX/MIRALAX)  powder; Take 17 g by mouth 2 (two) times daily as needed.  Dispense: 3350 g; Refill: 1   Labs reviewed and discussed Health Maintenance reviewed Diet and exercise encouraged  Follow up plan: 1 year    Jannifer Rodney, FNP

## 2018-04-17 DIAGNOSIS — R945 Abnormal results of liver function studies: Secondary | ICD-10-CM | POA: Diagnosis not present

## 2018-04-17 DIAGNOSIS — Z0130 Encounter for examination of blood pressure without abnormal findings: Secondary | ICD-10-CM | POA: Diagnosis not present

## 2018-04-22 DIAGNOSIS — R945 Abnormal results of liver function studies: Secondary | ICD-10-CM | POA: Diagnosis not present

## 2018-04-25 ENCOUNTER — Ambulatory Visit: Payer: BLUE CROSS/BLUE SHIELD | Admitting: Family

## 2018-04-25 ENCOUNTER — Encounter: Payer: Self-pay | Admitting: Family

## 2018-04-25 VITALS — BP 144/91 | HR 90 | Temp 98.4°F | Ht 72.0 in | Wt 196.0 lb

## 2018-04-25 DIAGNOSIS — R748 Abnormal levels of other serum enzymes: Secondary | ICD-10-CM

## 2018-04-25 DIAGNOSIS — K5900 Constipation, unspecified: Secondary | ICD-10-CM | POA: Diagnosis not present

## 2018-04-25 NOTE — Progress Notes (Signed)
Subjective:    Patient ID: Jose Golden Bogle, male    DOB: 10/24/1970, 47 y.o.   MRN: [id number]  Chief Complaint  Patient presents with  . discuss lab results   PT presents to the office today discuss lab work that was drawn at work. He states he had annual lab work drawn at work and his AST and ALT was slightly elevated. His AST was 47 and ALT was 52. He denies any alcohol intake and minium tylenol intake.   Does admit to a hight fat diet.  Constipation  This is a chronic problem. The current episode started more than 1 year ago. The problem has been waxing and waning since onset. His stool frequency is 4 to 5 times per week. He has tried laxatives for the symptoms. The treatment provided mild relief.      Review of Systems  Gastrointestinal: Positive for constipation.  All other systems reviewed and are negative.      Objective:   Physical Exam Vitals signs reviewed.  Constitutional:      General: He is not in acute distress.    Appearance: He is well-developed.  HENT:     Head: Normocephalic.     Right Ear: Tympanic membrane normal.     Left Ear: Tympanic membrane normal.  Eyes:     General:        Right eye: No discharge.        Left eye: No discharge.     Pupils: Pupils are equal, round, and reactive to light.  Neck:     Musculoskeletal: Normal range of motion and neck supple.     Thyroid: No thyromegaly.  Cardiovascular:     Rate and Rhythm: Normal rate and regular rhythm.     Heart sounds: Normal heart sounds. No murmur.  Pulmonary:     Effort: Pulmonary effort is normal. No respiratory distress.     Breath sounds: Normal breath sounds. No wheezing.  Abdominal:     General: Bowel sounds are normal. There is no distension.     Palpations: Abdomen is soft.     Tenderness: There is no abdominal tenderness.  Musculoskeletal: Normal range of motion.        General: No tenderness.  Skin:    General: Skin is warm and dry.     Findings: No erythema or rash.    Neurological:     Mental Status: He is alert and oriented to person, place, and time.     Cranial Nerves: No cranial nerve deficit.     Deep Tendon Reflexes: Reflexes are normal and symmetric.  Psychiatric:        Behavior: Behavior normal.        Thought Content: Thought content normal.        Judgment: Judgment normal.     BP (!) 144/91   Pulse 90   Temp 98.4 F (36.9 C) (Oral)   Ht 6' (1.829 m)   Wt 196 lb (88.9 kg)   BMI 26.58 kg/m      Assessment & Plan:  Jose Golden Lessig comes in today with chief complaint of discuss lab results   Diagnosis and orders addressed:  1. Elevated liver enzymes Continue to avoid Tylenol and alcohol Low fat diet  Labs pending If continues to rise may need US Pt will recheck labs at work in one month - Hepatic function panel - Hepatitis panel, acute  2. Constipation, unspecified constipation type Restart miralax daily    [place] Lake[place]y  Lenna Gilford, [place]

## 2018-04-25 NOTE — Patient Instructions (Signed)
Fat and Cholesterol Restricted Eating Plan Eating a diet that limits fat and cholesterol may help lower your risk for heart disease and other conditions. Your body needs fat and cholesterol for basic functions, but eating too much of these things can be harmful to your health. Your health care provider may order lab tests to check your blood fat (lipid) and cholesterol levels. This helps your health care provider understand your risk for certain conditions and whether you need to make diet changes. Work with your health care provider or dietitian to make an eating plan that is right for you. Your plan includes:  Limit your fat intake to ______% or less of your total calories a day.  Limit your saturated fat intake to ______% or less of your total calories a day.  Limit the amount of cholesterol in your diet to less than _________mg a day.  Eat ___________ g of fiber a day. What are tips for following this plan? General guidelines   If you are overweight, work with your health care provider to lose weight safely. Losing just 5-10% of your body weight can improve your overall health and help prevent diseases such as diabetes and heart disease.  Avoid: ? Foods with added sugar. ? Fried foods. ? Foods that contain partially hydrogenated oils, including stick margarine, some tub margarines, cookies, crackers, and other baked goods.  Limit alcohol intake to no more than 1 drink a day for nonpregnant women and 2 drinks a day for men. One drink equals 12 oz of beer, 5 oz of wine, or 1 oz of hard liquor. Reading food labels  Check food labels for: ? Trans fats, partially hydrogenated oils, or high amounts of saturated fat. Avoid foods that contain saturated fat and trans fat. ? The amount of cholesterol in each serving. Try to eat no more than 200 mg of cholesterol each day. ? The amount of fiber in each serving. Try to eat at least 20-30 g of fiber each day.  Choose foods with healthy fats,  such as: ? Monounsaturated and polyunsaturated fats. These include olive and canola oil, flaxseeds, walnuts, almonds, and seeds. ? Omega-3 fats. These are found in foods such as salmon, mackerel, sardines, tuna, flaxseed oil, and ground flaxseeds.  Choose grain products that have whole grains. Look for the word "whole" as the first word in the ingredient list. Cooking  Cook foods using methods other than frying. Baking, boiling, grilling, and broiling are some healthy options.  Eat more home-cooked food and less restaurant, buffet, and fast food.  Avoid cooking using saturated fats. ? Animal sources of saturated fats include meats, butter, and cream. ? Plant sources of saturated fats include palm oil, palm kernel oil, and coconut oil. Meal planning   At meals, imagine dividing your plate into fourths: ? Fill one-half of your plate with vegetables and green salads. ? Fill one-fourth of your plate with whole grains. ? Fill one-fourth of your plate with lean protein foods.  Eat fish that is high in omega-3 fats at least two times a week.  Eat more foods that contain fiber, such as whole grains, beans, apples, broccoli, carrots, peas, and barley. These foods help promote healthy cholesterol levels in the blood. Recommended foods Grains  Whole grains, such as whole wheat or whole grain breads, crackers, cereals, and pasta. Unsweetened oatmeal, bulgur, barley, quinoa, or brown rice. Corn or whole wheat flour tortillas. Vegetables  Fresh or frozen vegetables (raw, steamed, roasted, or grilled). Green salads. Fruits    All fresh, canned (in natural juice), or frozen fruits. Meats and other protein foods  Ground beef (85% or leaner), grass-fed beef, or beef trimmed of fat. Skinless chicken or [place]. Ground chicken or [place]. Pork trimmed of fat. All fish and seafood. Egg whites. Dried beans, peas, or lentils. Unsalted nuts or seeds. Unsalted canned beans. Natural nut butters without added  sugar and oil. Dairy  Low-fat or nonfat dairy products, such as skim or 1% milk, 2% or reduced-fat cheeses, low-fat and fat-free ricotta or cottage cheese, or plain low-fat and nonfat yogurt. Fats and oils  Tub margarine without trans fats. Light or reduced-fat mayonnaise and salad dressings. Avocado. Olive, canola, sesame, or safflower oils. The items listed above may not be a complete list of recommended foods or beverages. Contact your dietitian for more options. Foods to avoid Grains  White bread. White pasta. White rice. Cornbread. Bagels, pastries, and croissants. Crackers and snack foods that contain trans fat and hydrogenated oils. Vegetables  Vegetables cooked in cheese, cream, or butter sauce. Fried vegetables. Fruits  Canned fruit in heavy syrup. Fruit in cream or butter sauce. Fried fruit. Meats and other protein foods  Fatty cuts of meat. Ribs, chicken wings, bacon, sausage, bologna, salami, chitterlings, fatback, hot dogs, bratwurst, and packaged lunch meats. Liver and organ meats. Whole eggs and egg yolks. Chicken and [place] with skin. Fried meat. Dairy  Whole or 2% milk, cream, half-and-half, and cream cheese. Whole milk cheeses. Whole-fat or sweetened yogurt. Full-fat cheeses. Nondairy creamers and whipped toppings. Processed cheese, cheese spreads, and cheese curds. Beverages  Alcohol. Sugar-sweetened drinks such as sodas, lemonade, and fruit drinks. Fats and oils  Butter, stick margarine, lard, shortening, ghee, or bacon fat. Coconut, palm kernel, and palm oils. Sweets and desserts  Corn syrup, sugars, honey, and molasses. Candy. Jam and jelly. Syrup. Sweetened cereals. Cookies, pies, cakes, donuts, muffins, and ice cream. The items listed above may not be a complete list of foods and beverages to avoid. Contact your dietitian for more information. Summary  Your body needs fat and cholesterol for basic functions. However, eating too much of these things can be  harmful to your health.  Work with your health care provider and dietitian to follow a diet low in fat and cholesterol. Doing this may help lower your risk for heart disease and other conditions.  Choose healthy fats, such as monounsaturated and polyunsaturated fats, and foods high in omega-3 fatty acids.  Eat fiber-rich foods, such as whole grains, beans, peas, fruits, and vegetables.  Limit or avoid alcohol, fried foods, and foods high in saturated fats, partially hydrogenated oils, and sugar. This information is not intended to replace advice given to you by your health care provider. Make sure you discuss any questions you have with your health care provider. Document Released: 04/24/2005 Document Revised: 01/09/2017 Document Reviewed: 01/09/2017 Elsevier Interactive Patient Education  2019 Elsevier Inc. Fatty Liver Disease  Fatty liver disease occurs when too much fat has built up in your liver cells. Fatty liver disease is also called hepatic steatosis or steatohepatitis. The liver removes harmful substances from your bloodstream and produces fluids that your body needs. It also helps your body use and store energy from the food you eat. In many cases, fatty liver disease does not cause symptoms or problems. It is often diagnosed when tests are being done for other reasons. However, over time, fatty liver can cause inflammation that may lead to more serious liver problems, such as scarring of the liver (  cirrhosis) and liver failure. Fatty liver is associated with insulin resistance, increased body fat, high blood pressure (hypertension), and high cholesterol. These are features of metabolic syndrome and increase your risk for stroke, diabetes, and heart disease. What are the causes? This condition may be caused by:  Drinking too much alcohol.  Poor nutrition.  Obesity.  Cushing's syndrome.  Diabetes.  High cholesterol.  Certain drugs.  Poisons.  Some viral  infections.  Pregnancy. What increases the risk? You are more likely to develop this condition if you:  Abuse alcohol.  Are overweight.  Have diabetes.  Have hepatitis.  Have a high triglyceride level.  Are pregnant. What are the signs or symptoms? Fatty liver disease often does not cause symptoms. If symptoms do develop, they can include:  Fatigue.  Weakness.  Weight loss.  Confusion.  Abdominal pain.  Nausea and vomiting.  Yellowing of your skin and the white parts of your eyes (jaundice).  Itchy skin. How is this diagnosed? This condition may be diagnosed by:  A physical exam and medical history.  Blood tests.  Imaging tests, such as an ultrasound, CT scan, or MRI.  A liver biopsy. A small sample of liver tissue is removed using a needle. The sample is then looked at under a microscope. How is this treated? Fatty liver disease is often caused by other health conditions. Treatment for fatty liver may involve medicines and lifestyle changes to manage conditions such as:  Alcoholism.  High cholesterol.  Diabetes.  Being overweight or obese. Follow these instructions at home:   Do not drink alcohol. If you have trouble quitting, ask your health care provider how to safely quit with the help of medicine or a supervised program. This is important to keep your condition from getting worse.  Eat a healthy diet as told by your health care provider. Ask your health care provider about working with a diet and nutrition specialist (dietitian) to develop an eating plan.  Exercise regularly. This can help you lose weight and control your cholesterol and diabetes. Talk to your health care provider about an exercise plan and which activities are best for you.  Take over-the-counter and prescription medicines only as told by your health care provider.  Keep all follow-up visits as told by your health care provider. This is important. Contact a health care provider  if: You have trouble controlling your:  Blood sugar. This is especially important if you have diabetes.  Cholesterol.  Drinking of alcohol. Get help right away if:  You have abdominal pain.  You have jaundice.  You have nausea and vomiting.  You vomit blood or material that looks like coffee grounds.  You have stools that are black, tar-like, or bloody. Summary  Fatty liver disease develops when too much fat builds up in the cells of your liver.  Fatty liver disease often causes no symptoms or problems. However, over time, fatty liver can cause inflammation that may lead to more serious liver problems, such as scarring of the liver (cirrhosis).  You are more likely to develop this condition if you abuse alcohol, are pregnant, are overweight, have diabetes, have hepatitis, or have high triglyceride levels.  Contact your health care provider if you have trouble controlling your weight, blood sugar, cholesterol, or drinking of alcohol. This information is not intended to replace advice given to you by your health care provider. Make sure you discuss any questions you have with your health care provider. Document Released: 06/09/2005 Document Revised: 01/31/2017 Document  Reviewed: 01/31/2017 Elsevier Interactive Patient Education  [employer]2019 [employer] Inc[employer]

## 2018-04-26 LAB — HEPATIC FUNCTION PANEL
ALBUMIN: 4.5 g/dL (ref 3.5–5.5)
ALT: 44 IU/L (ref 0–44)
AST: 35 IU/L (ref 0–40)
Alkaline Phosphatase: 50 IU/L (ref 39–117)
BILIRUBIN TOTAL: 1.1 mg/dL (ref 0.0–1.2)
BILIRUBIN, DIRECT: 0.22 mg/dL (ref 0.00–0.40)
TOTAL PROTEIN: 6.9 g/dL (ref 6.0–8.5)

## 2018-04-26 LAB — HEPATITIS PANEL, ACUTE
HEP B S AG: NEGATIVE
Hep A IgM: NEGATIVE
Hep B C IgM: NEGATIVE
Hep C Virus Ab: <0.1 s/co ratio (ref 0.0–0.9)

## 2018-06-10 DIAGNOSIS — E559 Vitamin D deficiency, unspecified: Secondary | ICD-10-CM | POA: Diagnosis not present

## 2018-06-10 DIAGNOSIS — E785 Hyperlipidemia, unspecified: Secondary | ICD-10-CM | POA: Diagnosis not present

## 2018-06-10 DIAGNOSIS — Z008 Encounter for other general examination: Secondary | ICD-10-CM | POA: Diagnosis not present

## 2018-06-10 DIAGNOSIS — Z719 Counseling, unspecified: Secondary | ICD-10-CM | POA: Diagnosis not present

## 2018-10-23 DIAGNOSIS — E785 Hyperlipidemia, unspecified: Secondary | ICD-10-CM | POA: Diagnosis not present

## 2019-01-24 ENCOUNTER — Other Ambulatory Visit: Payer: Self-pay

## 2019-01-27 ENCOUNTER — Encounter: Payer: Self-pay | Admitting: Family

## 2019-01-27 ENCOUNTER — Other Ambulatory Visit: Payer: Self-pay

## 2019-01-27 ENCOUNTER — Ambulatory Visit (INDEPENDENT_AMBULATORY_CARE_PROVIDER_SITE_OTHER): Payer: BC Managed Care – PPO

## 2019-01-27 ENCOUNTER — Ambulatory Visit: Payer: BLUE CROSS/BLUE SHIELD | Admitting: Family

## 2019-01-27 VITALS — BP 142/88 | HR 82 | Temp 97.8°F | Resp 20 | Ht 72.0 in | Wt 185.0 lb

## 2019-01-27 DIAGNOSIS — E785 Hyperlipidemia, unspecified: Secondary | ICD-10-CM

## 2019-01-27 DIAGNOSIS — Z0000 Encounter for general adult medical examination without abnormal findings: Secondary | ICD-10-CM

## 2019-01-27 DIAGNOSIS — Z0001 Encounter for general adult medical examination with abnormal findings: Secondary | ICD-10-CM

## 2019-01-27 DIAGNOSIS — R1032 Left lower quadrant pain: Secondary | ICD-10-CM

## 2019-01-27 DIAGNOSIS — R109 Unspecified abdominal pain: Secondary | ICD-10-CM | POA: Diagnosis not present

## 2019-01-27 DIAGNOSIS — E663 Overweight: Secondary | ICD-10-CM

## 2019-01-27 NOTE — Patient Instructions (Signed)
Hemorrhoids °Hemorrhoids are swollen veins in and around the rectum or anus. There are two types of hemorrhoids: °· Internal hemorrhoids. These occur in the veins that are just inside the rectum. They may poke through to the outside and become irritated and painful. °· External hemorrhoids. These occur in the veins that are outside the anus and can be felt as a painful swelling or hard lump near the anus. °Most hemorrhoids do not cause serious problems, and they can be managed with home treatments such as diet and lifestyle changes. If home treatments do not help the symptoms, procedures can be done to shrink or remove the hemorrhoids. °What are the causes? °This condition is caused by increased pressure in the anal area. This pressure may result from various things, including: °· Constipation. °· Straining to have a bowel movement. °· Diarrhea. °· Pregnancy. °· Obesity. °· Sitting for long periods of time. °· Heavy lifting or other activity that causes you to strain. °· Anal sex. °· Riding a bike for a long period of time. °What are the signs or symptoms? °Symptoms of this condition include: °· Pain. °· Anal itching or irritation. °· Rectal bleeding. °· Leakage of stool (feces). °· Anal swelling. °· One or more lumps around the anus. °How is this diagnosed? °This condition can often be diagnosed through a visual exam. Other exams or tests may also be done, such as: °· An exam that involves feeling the rectal area with a gloved hand (digital rectal exam). °· An exam of the anal canal that is done using a small tube (anoscope). °· A blood test, if you have lost a significant amount of blood. °· A test to look inside the colon using a flexible tube with a camera on the end (sigmoidoscopy or colonoscopy). °How is this treated? °This condition can usually be treated at home. However, various procedures may be done if dietary changes, lifestyle changes, and other home treatments do not help your symptoms. These  procedures can help make the hemorrhoids smaller or remove them completely. Some of these procedures involve surgery, and others do not. Common procedures include: °· Rubber band ligation. Rubber bands are placed at the base of the hemorrhoids to cut off their blood supply. °· Sclerotherapy. Medicine is injected into the hemorrhoids to shrink them. °· Infrared coagulation. A type of light energy is used to get rid of the hemorrhoids. °· Hemorrhoidectomy surgery. The hemorrhoids are surgically removed, and the veins that supply them are tied off. °· Stapled hemorrhoidopexy surgery. The surgeon staples the base of the hemorrhoid to the rectal wall. °Follow these instructions at home: °Eating and drinking ° °· Eat foods that have a lot of fiber in them, such as whole grains, beans, nuts, fruits, and vegetables. °· Ask your health care provider about taking products that have added fiber (fiber supplements). °· Reduce the amount of fat in your diet. You can do this by eating low-fat dairy products, eating less red meat, and avoiding processed foods. °· Drink enough fluid to keep your urine pale yellow. °Managing pain and swelling ° °· Take warm sitz baths for 20 minutes, 3-4 times a day to ease pain and discomfort. You may do this in a bathtub or using a portable sitz bath that fits over the toilet. °· If directed, apply ice to the affected area. Using ice packs between sitz baths may be helpful. °? Put ice in a plastic bag. °? Place a towel between your skin and the bag. °? Leave   the ice on for 20 minutes, 2-3 times a day. °General instructions °· Take over-the-counter and prescription medicines only as told by your health care provider. °· Use medicated creams or suppositories as told. °· Get regular exercise. Ask your health care provider how much and what kind of exercise is best for you. In general, you should do moderate exercise for at least 30 minutes on most days of the week (150 minutes each week). This can  include activities such as walking, biking, or yoga. °· Go to the bathroom when you have the urge to have a bowel movement. Do not wait. °· Avoid straining to have bowel movements. °· Keep the anal area dry and clean. Use wet toilet paper or moist towelettes after a bowel movement. °· Do not sit on the toilet for long periods of time. This increases blood pooling and pain. °· Keep all follow-up visits as told by your health care provider. This is important. °Contact a health care provider if you have: °· Increasing pain and swelling that are not controlled by treatment or medicine. °· Difficulty having a bowel movement, or you are unable to have a bowel movement. °· Pain or inflammation outside the area of the hemorrhoids. °Get help right away if you have: °· Uncontrolled bleeding from your rectum. °Summary °· Hemorrhoids are swollen veins in and around the rectum or anus. °· Most hemorrhoids can be managed with home treatments such as diet and lifestyle changes. °· Taking warm sitz baths can help ease pain and discomfort. °· In severe cases, procedures or surgery can be done to shrink or remove the hemorrhoids. °This information is not intended to replace advice given to you by your health care provider. Make sure you discuss any questions you have with your health care provider. °Document Released: 04/21/2000 Document Revised: 05/02/2018 Document Reviewed: 09/13/2017 °Elsevier Patient Education © 2020 Elsevier Inc. °Constipation, Adult °Constipation is when a person has fewer bowel movements in a week than normal, has difficulty having a bowel movement, or has stools that are dry, hard, or larger than normal. Constipation may be caused by an underlying condition. It may become worse with age if a person takes certain medicines and does not take in enough fluids. °Follow these instructions at home: °Eating and drinking ° °· Eat foods that have a lot of fiber, such as fresh fruits and vegetables, whole grains, and  beans. °· Limit foods that are high in fat, low in fiber, or overly processed, such as french fries, hamburgers, cookies, candies, and soda. °· Drink enough fluid to keep your urine clear or pale yellow. °General instructions °· Exercise regularly or as told by your health care provider. °· Go to the restroom when you have the urge to go. Do not hold it in. °· Take over-the-counter and prescription medicines only as told by your health care provider. These include any fiber supplements. °· Practice pelvic floor retraining exercises, such as deep breathing while relaxing the lower abdomen and pelvic floor relaxation during bowel movements. °· Watch your condition for any changes. °· Keep all follow-up visits as told by your health care provider. This is important. °Contact a health care provider if: °· You have pain that gets worse. °· You have a fever. °· You do not have a bowel movement after 4 days. °· You vomit. °· You are not hungry. °· You lose weight. °· You are bleeding from the anus. °· You have thin, pencil-like stools. °Get help right away if: °·   You have a fever and your symptoms suddenly get worse. °· You leak stool or have blood in your stool. °· Your abdomen is bloated. °· You have severe pain in your abdomen. °· You feel dizzy or you faint. °This information is not intended to replace advice given to you by your health care provider. Make sure you discuss any questions you have with your health care provider. °Document Released: 01/21/2004 Document Revised: 04/06/2017 Document Reviewed: 10/13/2015 °Elsevier Patient Education © 2020 Elsevier Inc. ° °

## 2019-01-27 NOTE — Progress Notes (Signed)
Subjective:    Patient ID: Jose Golden, male    DOB: 08-04-1970, 48 y.o.   MRN: [id number]  Chief Complaint  Patient presents with  . Medical Management of Chronic Issues   Pt presents to the office today for CPE.  Hyperlipidemia This is a chronic problem. The current episode started more than 1 year ago. The problem is controlled. Recent lipid tests were reviewed and are normal. Current antihyperlipidemic treatment includes statins. The current treatment provides moderate improvement of lipids. Risk factors for coronary artery disease include dyslipidemia, male sex, hypertension, a sedentary lifestyle and post-menopausal.  Abdominal Pain The current episode started more than 1 year ago. The onset quality is gradual. The problem occurs intermittently.      Review of Systems  Gastrointestinal: Positive for abdominal pain.  All other systems reviewed and are negative.   Family History  Problem Relation Age of Onset  . Heart disease Mother   . Cancer Mother        kidney  . Heart disease Father        CABG  . Seizures Father     Social History   Socioeconomic History  . Marital status: Married    Spouse name: Not on file  . Number of children: Not on file  . Years of education: Not on file  . Highest education level: Not on file  Occupational History  . Occupation: Medical sales representative  Social Needs  . Financial resource strain: Not on file  . Food insecurity    Worry: Not on file    Inability: Not on file  . Transportation needs    Medical: Not on file    Non-medical: Not on file  Tobacco Use  . Smoking status: Never Smoker  . Smokeless tobacco: Never Used  Substance and Sexual Activity  . Alcohol use: No  . Drug use: No  . Sexual activity: Yes  Lifestyle  . Physical activity    Days per week: Not on file    Minutes per session: Not on file  . Stress: Not on file  Relationships  . Social Herbalist on phone: Not on file    Gets together: Not on file   Attends religious service: Not on file    Active member of club or organization: Not on file    Attends meetings of clubs or organizations: Not on file    Relationship status: Not on file  Other Topics Concern  . Not on file  Social History Narrative  . Not on file       Objective:   Physical Exam Vitals signs reviewed.  Constitutional:      General: He is not in acute distress.    Appearance: He is well-developed.  HENT:     Head: Normocephalic.     Right Ear: Tympanic membrane normal.     Left Ear: Tympanic membrane normal.  Eyes:     General:        Right eye: No discharge.        Left eye: No discharge.     Pupils: Pupils are equal, round, and reactive to light.  Neck:     Musculoskeletal: Normal range of motion and neck supple.     Thyroid: No thyromegaly.  Cardiovascular:     Rate and Rhythm: Normal rate and regular rhythm.     Heart sounds: Normal heart sounds. No murmur.  Pulmonary:     Effort: Pulmonary effort is normal. No respiratory  distress.     Breath sounds: Normal breath sounds. No wheezing.  Abdominal:     General: Bowel sounds are normal. There is no distension.     Palpations: Abdomen is soft.     Tenderness: There is no abdominal tenderness.  Genitourinary:    Rectum: External hemorrhoid present.  Musculoskeletal: Normal range of motion.        General: No tenderness.  Skin:    General: Skin is warm and dry.     Findings: No erythema or rash.  Neurological:     Mental Status: He is alert and oriented to person, place, and time.     Cranial Nerves: No cranial nerve deficit.     Deep Tendon Reflexes: Reflexes are normal and symmetric.  Psychiatric:        Behavior: Behavior normal.        Thought Content: Thought content normal.        Judgment: Judgment normal.       BP (!) 142/88   Pulse 82   Temp 97.8 F (36.6 C) (Temporal)   Resp 20   Ht 6' (1.829 m)   Wt 185 lb (83.9 kg)   SpO2 100%   BMI 25.09 kg/m      Assessment & Plan:   Jose Golden comes in today with chief complaint of Medical Management of Chronic Issues   Diagnosis and orders addressed:  1. Annual physical exam - CMP14+EGFR - CBC with Differential/Platelet - Lipid panel - PSA, total and free - TSH - DG Abd 1 View; Future  2. Overweight (BMI 25.0-29.9) - CMP14+EGFR - CBC with Differential/Platelet  3. Hyperlipidemia, unspecified hyperlipidemia type - CMP14+EGFR - CBC with Differential/Platelet - Lipid panel  4. Left lower quadrant abdominal pain More than likely related to constipation  Increase fluids and encouraged high fiber diet - CMP14+EGFR - CBC with Differential/Platelet - DG Abd 1 View; Future   Labs pending Health Maintenance reviewed Diet and exercise encouraged  Follow up plan: 1 year   Evelina Dun, FNP

## 2019-01-28 ENCOUNTER — Other Ambulatory Visit: Payer: Self-pay | Admitting: Family

## 2019-01-28 DIAGNOSIS — R1032 Left lower quadrant pain: Secondary | ICD-10-CM

## 2019-01-28 LAB — CBC WITH DIFFERENTIAL/PLATELET
Basophils Absolute: 0 10*3/uL (ref 0.0–0.2)
Basos: 1 %
EOS (ABSOLUTE): 0.2 10*3/uL (ref 0.0–0.4)
Eos: 3 %
Hematocrit: 45.2 % (ref 37.5–51.0)
Hemoglobin: 14.6 g/dL (ref 13.0–17.7)
Immature Grans (Abs): 0 10*3/uL (ref 0.0–0.1)
Immature Granulocytes: 0 %
Lymphocytes Absolute: 1.8 10*3/uL (ref 0.7–3.1)
Lymphs: 31 %
MCH: 29.1 pg (ref 26.6–33.0)
MCHC: 32.3 g/dL (ref 31.5–35.7)
MCV: 90 fL (ref 79–97)
Monocytes Absolute: 0.7 10*3/uL (ref 0.1–0.9)
Monocytes: 12 %
Neutrophils Absolute: 3.1 10*3/uL (ref 1.4–7.0)
Neutrophils: 53 %
Platelets: 234 10*3/uL (ref 150–450)
RBC: 5.02 x10E6/uL (ref 4.14–5.80)
RDW: 12.4 % (ref 11.6–15.4)
WBC: 5.8 10*3/uL (ref 3.4–10.8)

## 2019-01-28 LAB — CMP14+EGFR
ALT: 33 IU/L (ref 0–44)
AST: 27 IU/L (ref 0–40)
Albumin/Globulin Ratio: 2 (ref 1.2–2.2)
Albumin: 4.7 g/dL (ref 4.0–5.0)
Alkaline Phosphatase: 57 IU/L (ref 39–117)
BUN/Creatinine Ratio: 11 (ref 9–20)
BUN: 10 mg/dL (ref 6–24)
Bilirubin Total: 0.9 mg/dL (ref 0.0–1.2)
CO2: 25 mmol/L (ref 20–29)
Calcium: 9.7 mg/dL (ref 8.7–10.2)
Chloride: 101 mmol/L (ref 96–106)
Creatinine, Ser: 0.91 mg/dL (ref 0.76–1.27)
GFR calc Af Amer: 114 mL/min/{1.73_m2} (ref >59)
GFR calc non Af Amer: 99 mL/min/{1.73_m2} (ref >59)
Globulin, Total: 2.4 g/dL (ref 1.5–4.5)
Glucose: 93 mg/dL (ref 65–99)
Potassium: 4.4 mmol/L (ref 3.5–5.2)
Sodium: 142 mmol/L (ref 134–144)
Total Protein: 7.1 g/dL (ref 6.0–8.5)

## 2019-01-28 LAB — LIPID PANEL
Chol/HDL Ratio: 3 ratio (ref 0.0–5.0)
Cholesterol, Total: 161 mg/dL (ref 100–199)
HDL: 53 mg/dL (ref >39)
LDL Chol Calc (NIH): 94 mg/dL (ref 0–99)
Triglycerides: 73 mg/dL (ref 0–149)
VLDL Cholesterol Cal: 14 mg/dL (ref 5–40)

## 2019-01-28 LAB — HIV ANTIBODY (ROUTINE TESTING W REFLEX): HIV Screen 4th Generation wRfx: NONREACTIVE

## 2019-01-28 LAB — PSA, TOTAL AND FREE
PSA, Free Pct: 21.3 %
PSA, Free: 0.17 ng/mL
Prostate Specific Ag, Serum: 0.8 ng/mL (ref 0.0–4.0)

## 2019-01-28 LAB — TSH: TSH: 2.76 u[IU]/mL (ref 0.450–4.500)

## 2019-01-30 ENCOUNTER — Encounter: Payer: Self-pay | Admitting: Internal Medicine

## 2019-02-17 DIAGNOSIS — Z23 Encounter for immunization: Secondary | ICD-10-CM | POA: Diagnosis not present

## 2019-02-20 NOTE — Progress Notes (Signed)
Referring Provider: Junie Spencer, FNP Primary Care Physician:  Junie Spencer, FNP Primary Gastroenterologist:  Dr. Jena Gauss  Chief Complaint  Patient presents with  . abdominal discomfort    llq x Nov 2019, abd xray 01/27/19    HPI:   Jose Golden is a 48 y.o. male presenting today at the request of Junie Spencer, FNP for LLQ abdominal pain.   Patient recently saw his PCP on 01/27/2019 for his annual exam and complained of intermittent left lower quadrant abdominal pain present for over 1 year.  Suspected this was possibly related to constipation.  Labs and abdominal x-ray were ordered.  Labs with CMP normal, CBC normal, lipid panel normal, TSH normal, PSA normal, and HIV nonreactive.  Abdominal x-ray with no significant findings.  Today he states he has had more of a discomfort or annoyance in his LLQ.  Dull and aching. No sharp pain. Doesn't affect his daily activities. Doesn't keep him up at night. Started a little over a year ago. Has been progressing in frequency not in severity. Symptoms daily.  Usually feels the LLQ discomfort when sitting down. Feels like he just needs to readjust. Other times just on and off daily.  If he needs to have a BM, pain will be somewhat worse. Movement, heay lifting, walking, long standing, or any foods do not seem to trigger pain. Works in a [employer] as Psychologist, educational. Doesn't have to do heavy lifting. Was on purlax last year. Helped BM frequency. Not sure if there was any change/improvement in symptoms. BMs usually daily, sometimes every other day. Occasional straining. Hard stools at times. Occasional incomplete BMs.   No blood in the stool. No black stools. No prior colonoscopy. No family history of colon cancer. No unintentional weight loss. No nausea, vomiting, fever, chills.   Occasional acid reflux. Once a week or less. Fatty and fried foods trigger this. Will chew tums which work well. Doesn't feel like it occurs frequently enough to be on  anything daily. No upper abdominal pain. No trouble swallowing.    Past Medical History:  Diagnosis Date  . Allergy   . Hyperlipidemia     History reviewed. No pertinent surgical history.  Current Outpatient Medications  Medication Sig Dispense Refill  . cetirizine (ZYRTEC) 10 MG tablet Take 10 mg by mouth daily.    . Multiple Vitamin (MULTIVITAMIN) tablet Take 1 tablet by mouth daily.    . pravastatin (PRAVACHOL) 40 MG tablet TAKE 1 TABLET DAILY 30 tablet 0   No current facility-administered medications for this visit.     Allergies as of 02/21/2019 - Review Complete 02/21/2019  Allergen Reaction Noted  . English plantain Other (See Comments) 03/14/2018    Family History  Problem Relation Age of Onset  . Heart disease Mother   . Cancer Mother        kidney  . Heart disease Father        CABG  . Seizures Father   . Colon cancer Neg Hx     Social History   Socioeconomic History  . Marital status: Married    Spouse name: Not on file  . Number of children: Not on file  . Years of education: Not on file  . Highest education level: Not on file  Occupational History  . Occupation: Warehouse manager  Social Needs  . Financial resource strain: Not on file  . Food insecurity    Worry: Not on file    Inability: Not on file  .  Transportation needs    Medical: Not on file    Non-medical: Not on file  Tobacco Use  . Smoking status: Never Smoker  . Smokeless tobacco: Never Used  Substance and Sexual Activity  . Alcohol use: No  . Drug use: No  . Sexual activity: Yes  Lifestyle  . Physical activity    Days per week: Not on file    Minutes per session: Not on file  . Stress: Not on file  Relationships  . Social Herbalist on phone: Not on file    Gets together: Not on file    Attends religious service: Not on file    Active member of club or organization: Not on file    Attends meetings of clubs or organizations: Not on file    Relationship status: Not on  file  . Intimate partner violence    Fear of current or ex partner: Not on file    Emotionally abused: Not on file    Physically abused: Not on file    Forced sexual activity: Not on file  Other Topics Concern  . Not on file  Social History Narrative  . Not on file    Review of Systems: Gen: Denies lightheadedness, dizziness, pre-syncope, or syncope CV: Denies chest pain, heart palpitations.  Resp: Denies shortness of breath or cough GI: See HPI GU : Denies urinary burning, urinary frequency, urinary hesitancy. No testicular pain. MS: Denies joint pain, muscle weakness Derm: Denies rash Psych: Denies depression, anxiety Heme: Denies bruising, bleeding  Physical Exam: BP (!) 153/92   Pulse 97   Temp (!) 97.1 F (36.2 C) (Oral)   Ht 6' (1.829 m)   Wt 186 lb 9.6 oz (84.6 kg)   BMI 25.31 kg/m  General:   Alert and oriented. Pleasant and cooperative. Well-nourished and well-developed.  Head:  Normocephalic and atraumatic. Eyes:  Without icterus, sclera clear and conjunctiva pink.  Ears:  Normal auditory acuity. Nose:  No deformity, discharge,  or lesions. Lungs:  Clear to auscultation bilaterally. No wheezes, rales, or rhonchi. No distress.  Heart:  S1, S2 present without murmurs appreciated.  Abdomen:  +BS, soft, non-tender and non-distended. No HSM noted. No guarding or rebound. No masses appreciated.  Rectal:  Deferred  Msk:  Symmetrical without gross deformities. Normal posture. Extremities:  Without edema. Neurologic:  Alert and  oriented x4;  grossly normal neurologically. Skin:  Intact without significant lesions or rashes. Psych:  Normal mood and affect.

## 2019-02-21 ENCOUNTER — Ambulatory Visit (INDEPENDENT_AMBULATORY_CARE_PROVIDER_SITE_OTHER): Payer: BC Managed Care – PPO | Admitting: Gastroenterology

## 2019-02-21 ENCOUNTER — Other Ambulatory Visit: Payer: Self-pay

## 2019-02-21 ENCOUNTER — Encounter: Payer: Self-pay | Admitting: Gastroenterology

## 2019-02-21 VITALS — BP 153/92 | HR 97 | Temp 97.1°F | Ht 72.0 in | Wt 186.6 lb

## 2019-02-21 DIAGNOSIS — R1032 Left lower quadrant pain: Secondary | ICD-10-CM | POA: Insufficient documentation

## 2019-02-21 NOTE — Patient Instructions (Signed)
I am ordering an [place] of your abdomen to see if there is any sign of hernia. We will call you with results.   Please add daily fiber supplement. Benefiber or metamucil are good choices. Start with a low dose and increase slowly.   Please add MiraLAX 1/2 capful daily to see if this helps with straining and hard stools. You may increase to 1 capful daily if needed or decrease frequency if you develop diarrhea.   Follow-up in 2 months.   Aliene Altes, PA-C [facility]

## 2019-02-21 NOTE — Assessment & Plan Note (Signed)
48 y.o. male with past medical history of HLD and seasons allergies who presents with 1 year history of intermittent LLQ dull/aching pain. Progressing in frequency over the last year, now daily. Usually present when sitting, feels like he needs to readjust. Other times at random. Sometimes worse before having a BM. BMs are daily to every other day. Straining and hard stools at times. Pain is not affected by movement, heavy lifting, walking, long standing, or any foods. Denies brbpr, melena, unintentional weight loss. No other significant upper or lower GI symptoms. Benign abdominal exam today. CBC, CMP and TSH within normal limits on 01/27/19. DG abdomen on 01/27/19 with phleboliths in the pelvis, otherwise unremarkable.   Unclear etiology. As patient reports some worsening of pain prior to BMs as well as straining and occasional hard stools, I will start with trying to optimize his bowel regimen. Doubt underlying diverticulitis with such mild symptoms and benign abdominal exam. Query whether patient may have a small hernia in the LLQ.   Start benefiber or metamucil daily.  MiraLAX 1/2 capful daily. May increase to 1 capful daily if needed. Advised to decrease frequency if he developed diarrhea.  [place] Limited to LLQ for evaluation of possible hernia.  Follow-up in 2 months.

## 2019-02-26 ENCOUNTER — Other Ambulatory Visit: Payer: Self-pay

## 2019-02-26 ENCOUNTER — Ambulatory Visit (HOSPITAL_COMMUNITY)
Admission: RE | Admit: 2019-02-26 | Discharge: 2019-02-26 | Disposition: A | Discharge status: Home / Self Care | Payer: BC Managed Care – PPO | Source: Ambulatory Visit | Attending: Gastroenterology | Admitting: Gastroenterology

## 2019-02-26 DIAGNOSIS — R1032 Left lower quadrant pain: Secondary | ICD-10-CM | POA: Diagnosis not present

## 2019-02-26 NOTE — Progress Notes (Signed)
[place] without evidence of hernia, mass or other fluid collection. No explanation for his intermittent dull LLQ pain. We will continue with treatment as planned at the time of office visit and follow-up in office as scheduled. Patient should contact us if he has significant change or worsening of LLQ pain prior to his next visit.

## 2019-03-05 DIAGNOSIS — E785 Hyperlipidemia, unspecified: Secondary | ICD-10-CM | POA: Diagnosis not present

## 2019-03-05 DIAGNOSIS — E559 Vitamin D deficiency, unspecified: Secondary | ICD-10-CM | POA: Diagnosis not present

## 2019-03-05 DIAGNOSIS — Z0130 Encounter for examination of blood pressure without abnormal findings: Secondary | ICD-10-CM | POA: Diagnosis not present

## 2019-03-05 DIAGNOSIS — Z139 Encounter for screening, unspecified: Secondary | ICD-10-CM | POA: Diagnosis not present

## 2019-03-05 DIAGNOSIS — Z79899 Other long term (current) drug therapy: Secondary | ICD-10-CM | POA: Diagnosis not present

## 2019-03-12 DIAGNOSIS — E559 Vitamin D deficiency, unspecified: Secondary | ICD-10-CM | POA: Diagnosis not present

## 2019-03-12 DIAGNOSIS — E785 Hyperlipidemia, unspecified: Secondary | ICD-10-CM | POA: Diagnosis not present

## 2019-03-12 DIAGNOSIS — Z7189 Other specified counseling: Secondary | ICD-10-CM | POA: Diagnosis not present

## 2019-03-17 ENCOUNTER — Encounter: Payer: BLUE CROSS/BLUE SHIELD | Admitting: Family

## 2019-04-23 ENCOUNTER — Ambulatory Visit: Payer: BC Managed Care – PPO | Admitting: Gastroenterology

## 2019-04-23 NOTE — Progress Notes (Signed)
Referring Provider: Junie Spencer, FNP Primary Care Physician:  Junie Spencer, FNP Primary GI Physician: Dr. Jena Gauss  Chief Complaint  Patient presents with  . Abdominal Pain    "discomfort" llq; approx 1 year  . Rectal Pain    tenderness near rectum when sitting    HPI:   Jose Golden is a 48 y.o. male presenting today for follow-up of LLQ abdominal pain.   Patient was last seen in our office on 02/21/2019 at the time of initial consult for LLQ pain.  Patient reported 1 year history of intermittent LLQ dull/aching pain that was progressing in frequency and was occurring daily.  Present when sitting and felt like he needed to readjust.  Occasionally works before BMs.  Intermittent constipation.  Pain not affected by movement, heavy lifting, walking, long standing, or any foods.  Denied bright red blood per rectum, melena, unintentional weight loss.  Abdominal exam was benign.  Etiology not clear.  Plans to start with optimizing bowel regimen.  Doubted diverticulitis with mild symptoms and benign exam.  Question hernia.  Patient was advised to use MiraLAX 1/2 capful daily and increase to 1 capful daily if needed, start daily fiber supplement, complete limited ultrasound to evaluate for possible hernia, follow-up in 2 months.  Ultrasound pelvis on 02/26/2019 with no evidence of hernia, mass, or fluid collection in the area of symptomatic concern.  Today:  Using clearlax, 1 capful daily. Having BM daily most of the time. Rarely will go without a BM. Stools are soft and formed. No hard stools or straining. No diarrhea. Feels BMs are complete. Drinks about 1-2 bottles of water a day. Otherwise, coffee and Dt. Mt Dew. Urine is pale yellow to clear. No blood in the stool. No black stools. Abdominal pain is about the same. No change. About 1/10. Constant. No radiation. More of a dull sensation. Uses fiber supplement a couple times a week. Pain has been present for over 1 year. No urinary  symptoms.   Intermittent rectal discomfort. Tenderness at times when sitting.  Family doctor states he had a hemorrhoid. This has been going on for a couple months. Felt tender yesterday. None at this time. Discomfort may last an hour or two, but other times less. Sits a lot at work. No heavy lifting. No rectal pain when having a BM. No toilet tissue hematochezia. Reports feeling a prolapsed hemorrhoid 6 months ago. Hasn't used any creams for hemorrhoids. Spends 5-10 minutes on the toilet.   No heartburn, acid reflux, dysphagia. No unintentional weight loss. No nausea or vomiting.   Has never had a colonoscopy.   Past Medical History:  Diagnosis Date  . Allergy   . Hyperlipidemia     History reviewed. No pertinent surgical history.  Current Outpatient Medications  Medication Sig Dispense Refill  . cetirizine (ZYRTEC) 10 MG tablet Take 10 mg by mouth daily.    . Multiple Vitamin (MULTIVITAMIN) tablet Take 1 tablet by mouth daily.    . polyethylene glycol (MIRALAX / GLYCOLAX) 17 g packet Take 17 g by mouth daily.    . pravastatin (PRAVACHOL) 40 MG tablet TAKE 1 TABLET DAILY 30 tablet 0  . Wheat Dextrin (BENEFIBER) POWD Take by mouth as needed. 0.5- 1 tablespoon    . hydrocortisone (ANUSOL-HC) 2.5 % rectal cream Place 1 application rectally 2 (two) times daily. 30 g 1   No current facility-administered medications for this visit.    Allergies as of 04/24/2019 - Review Complete 04/24/2019  Allergen Reaction Noted  . English plantain Other (See Comments) 03/14/2018    Family History  Problem Relation Age of Onset  . Heart disease Mother   . Cancer Mother        kidney  . Heart disease Father        CABG  . Seizures Father   . Colon cancer Neg Hx     Social History   Socioeconomic History  . Marital status: Married    Spouse name: Not on file  . Number of children: Not on file  . Years of education: Not on file  . Highest education level: Not on file  Occupational  History  . Occupation: Medical sales representative  Tobacco Use  . Smoking status: Never Smoker  . Smokeless tobacco: Never Used  Substance and Sexual Activity  . Alcohol use: No  . Drug use: No  . Sexual activity: Yes  Other Topics Concern  . Not on file  Social History Narrative  . Not on file   Social Determinants of Health   Financial Resource Strain:   . Difficulty of Paying Living Expenses: Not on file  Food Insecurity:   . Worried About Charity fundraiser in the Last Year: Not on file  . Ran Out of Food in the Last Year: Not on file  Transportation Needs:   . Lack of Transportation (Medical): Not on file  . Lack of Transportation (Non-Medical): Not on file  Physical Activity:   . Days of Exercise per Week: Not on file  . Minutes of Exercise per Session: Not on file  Stress:   . Feeling of Stress : Not on file  Social Connections:   . Frequency of Communication with Friends and Family: Not on file  . Frequency of Social Gatherings with Friends and Family: Not on file  . Attends Religious Services: Not on file  . Active Member of Clubs or Organizations: Not on file  . Attends Archivist Meetings: Not on file  . Marital Status: Not on file    Review of Systems: Gen: Denies fever, chills, lightheadedness, dizziness, presyncope, or syncope. CV: Denies chest pain, palpitations Resp: Denies dyspnea at rest, cough GI: See HPI Derm: Denies rash Psych: Denies depression, anxiety Heme: Denies bruising, bleeding  Physical Exam: BP (!) 158/98   Pulse 88   Temp (!) 97.1 F (36.2 C) (Temporal)   Ht 6' (1.829 m)   Wt 187 lb 6.4 oz (85 kg)   BMI 25.42 kg/m  General:   Alert and oriented. No distress noted. Pleasant and cooperative.  Head:  Normocephalic and atraumatic. Eyes:  Conjuctiva clear without scleral icterus. Heart:  S1, S2 present without murmurs appreciated. Lungs:  Clear to auscultation bilaterally. No wheezes, rales, or rhonchi. No distress.  Abdomen:  +BS,  soft, non-tender and non-distended. No rebound or guarding. No HSM or masses noted. Rectal: Small internal hemorrhoids.  No external abnormalities.  No pain or blood on exam. Msk:  Symmetrical without gross deformities. Normal posture. Extremities:  Without edema. Neurologic:  Alert and  oriented x4 Psych:Normal mood and affect.

## 2019-04-24 ENCOUNTER — Ambulatory Visit (INDEPENDENT_AMBULATORY_CARE_PROVIDER_SITE_OTHER): Payer: BC Managed Care – PPO | Admitting: Gastroenterology

## 2019-04-24 ENCOUNTER — Other Ambulatory Visit: Payer: Self-pay

## 2019-04-24 ENCOUNTER — Encounter: Payer: Self-pay | Admitting: Gastroenterology

## 2019-04-24 ENCOUNTER — Encounter: Payer: Self-pay | Admitting: *Deleted

## 2019-04-24 ENCOUNTER — Other Ambulatory Visit: Payer: Self-pay | Admitting: *Deleted

## 2019-04-24 ENCOUNTER — Telehealth: Payer: Self-pay | Admitting: *Deleted

## 2019-04-24 VITALS — BP 158/98 | HR 88 | Temp 97.1°F | Ht 72.0 in | Wt 187.4 lb

## 2019-04-24 DIAGNOSIS — K6289 Other specified diseases of anus and rectum: Secondary | ICD-10-CM

## 2019-04-24 DIAGNOSIS — R1032 Left lower quadrant pain: Secondary | ICD-10-CM

## 2019-04-24 DIAGNOSIS — K5900 Constipation, unspecified: Secondary | ICD-10-CM | POA: Diagnosis not present

## 2019-04-24 MED ORDER — HYDROCORTISONE (PERIANAL) 2.5 % EX CREA: 1.0000 "application " | TOPICAL_CREAM | Freq: Two times a day (BID) | CUTANEOUS | 1 refills | Status: DC

## 2019-04-24 NOTE — Patient Instructions (Addendum)
Please have CT completed. We will help get this arranged.  You will also need your kidney function checked for this. We will place the lab orders.   I am sending in Anusol cream. This is for hemorrhoids. Apply this twice daily per your rectum x 7 days then use as needed.   Limit toilet time to 2-3 minutes at a time.   Continue Clearlax daily.   You may increase benefiber to daily as well. If this causes a lot of bloating, you may decrease frequency.   We will call you with results and further recommendations.   Aliene Altes, PA-C [facility]

## 2019-04-24 NOTE — Assessment & Plan Note (Addendum)
Constipation is much improved with ClearLax 1 capful daily and Benefiber twice a week.  Currently having soft, formed BMs daily.  Denies hard stools or straining.  He does have some intermittent rectal discomfort when sitting for the last 2 months.  Suspect this is likely related to internal hemorrhoids.  Denies bright red blood per rectum or melena.  Continue ClearLax 1 capful daily. May increase Benefiber to daily.  Advised to decrease frequency if he develops troublesome gas or bloating. Anusol twice daily x7 days and as needed for rectal discomfort/hemorrhoids. Limit toilet time to 2-3 minutes.

## 2019-04-24 NOTE — Telephone Encounter (Signed)
PA submitted via [employer] website. PA approved. Auth# [id number] dates 04/24/2019-10/20/2019

## 2019-04-24 NOTE — Assessment & Plan Note (Addendum)
Chronic LLQ abdominal/pelvic pain that has been present for over 1 year but progressing in frequency and has now become a constant dull pain.  About 1/10 in severity.  No aggravating or relieving factors.  Optimized bowel regimen at last visit.  Patient now having soft/formed BMs daily on ClearLax daily and Benefiber twice a week.  DG abdomen on 01/27/2019 with phleboliths in the pelvis, otherwise unremarkable.  Pelvic ultrasound completed on 02/26/2019 with no evidence of hernia, mass, or fluid collection in the area of symptomatic concern.  He denies bright red blood per rectum, melena, unintentional weight loss, or urinary symptoms.  At this time, will pursue CT abdomen and pelvis with contrast for further evaluation.  If this is unrevealing, would consider going ahead and scheduling patient for colonoscopy for complete GI evaluation.   CT abdomen and pelvis with contrast Continue ClearLax and Benefiber. Further recommendations to follow.

## 2019-04-24 NOTE — Assessment & Plan Note (Addendum)
Intermittent rectal discomfort, typically when sitting x2 months.  Reports feeling a prolapsed hemorrhoid about 6 months ago, family doctor completed rectal exam and told patient he had a hemorrhoid.  No prolapsed tissue at this time.  Denies rectal pain when having a BM, sharp rectal pain, or bright red blood per rectum.  He was having constipation when I saw him in October but this is much improved with ClearLax daily and Benefiber twice a week. He has not tried anything to help with discomfort.  He does sit a lot at work.  Spends 5-10 minutes on the toilet. Denies heavy lifting or straining. Rectal exam with small internal hemorrhoids, suspect this is a source of his rectal discomfort.  Start Anusol twice daily x7 days then as needed. Continue ClearLax 1 capful daily. May increase Benefiber to daily rather than twice a week.  Advised to decrease frequency if he developed troublesome gas or bloating. Limit toilet time to 2-3 minutes.

## 2019-05-15 ENCOUNTER — Ambulatory Visit (HOSPITAL_COMMUNITY)
Admission: RE | Admit: 2019-05-15 | Discharge: 2019-05-15 | Disposition: A | Discharge status: Home / Self Care | Payer: BC Managed Care – PPO | Source: Ambulatory Visit | Attending: Gastroenterology | Admitting: Gastroenterology

## 2019-05-15 ENCOUNTER — Other Ambulatory Visit: Payer: Self-pay

## 2019-05-15 DIAGNOSIS — R1032 Left lower quadrant pain: Secondary | ICD-10-CM

## 2019-05-15 DIAGNOSIS — K6289 Other specified diseases of anus and rectum: Secondary | ICD-10-CM

## 2019-05-15 DIAGNOSIS — R109 Unspecified abdominal pain: Secondary | ICD-10-CM | POA: Diagnosis not present

## 2019-05-15 MED ORDER — IOHEXOL 300 MG/ML  SOLN
100.0000 mL | Freq: Once | INTRAMUSCULAR | Status: AC | PRN
  Administered 2019-05-15: 100 mL via INTRAVENOUS

## 2019-05-15 NOTE — Progress Notes (Signed)
CT A/P without acute findings.  He does have retained stool noted in his rectum and throughout his colon which could be contributing to his mild LLQ pain. At his last visit, he was taking ClearLAX 1 capful daily and benefiber twice a week with BMs daily. I had advised he try increasing benefiber to daily. Can we see how he is doing with this? Still having BMs daily? Any improvement or worsening in LLQ abdominal pain?

## 2019-05-16 ENCOUNTER — Telehealth: Payer: Self-pay | Admitting: Internal Medicine

## 2019-05-16 NOTE — Progress Notes (Signed)
Noted. Lets continue working on bowel regularity to see if this helps with his mild pain. He may add a second capful of MiraLAX daily if needed. We do not have a follow-up scheduled at this time. Lets get him scheduled in 3 months.

## 2019-05-16 NOTE — Telephone Encounter (Signed)
Pt returning call. [phone]

## 2019-05-16 NOTE — Telephone Encounter (Signed)
Results given, see result note.  

## 2019-05-21 ENCOUNTER — Encounter: Payer: Self-pay | Admitting: Internal Medicine

## 2019-05-21 NOTE — Progress Notes (Signed)
PATIENT SCHEDULED AND LETTER SENT  °

## 2019-08-21 ENCOUNTER — Ambulatory Visit: Payer: BC Managed Care – PPO | Admitting: Gastroenterology

## 2019-09-07 NOTE — Progress Notes (Signed)
Referring Provider: Sharion Balloon, FNP Primary Care Physician:  Sharion Balloon, FNP Primary GI Physician: Dr. Gala Romney  Chief Complaint  Patient presents with  . Abdominal Pain    f/u, doing ok    HPI:   Jose Golden is a 49 y.o. male presenting today for follow-up.  Last seen in our office 04/24/2019 for LLQ abdominal pain, rectal pain, and constipation.  LLQ/pelvic pain has been present for 1 year and has become constant/dull about 1/10 in severity.  No aggravating or relieving factors.  Bowel regimen has been optimized at his prior visit and was currently having BMs daily with ClearLax daily and Benefiber twice a week.  DG abdomen 01/27/2019 with phleboliths in the pelvis, otherwise unremarkable.  Pelvic ultrasound 02/26/2019 without significant findings.  Plan to pursue CT A/P for further evaluation. Regarding rectal pain, this occurred when sitting for the last 2 months.  Denied rectal pain during BMs or BRBPR.  Rectal exam with small internal hemorrhoids which was suspected to be the source of his rectal discomfort.  Recommended Anusol cream twice daily x7 days and as needed.  CT A/P with contrast 05/15/2019: No acute findings.  He did have retained stool in the rectum and throughout the colon.  Suspected this may be contributing to mild LLQ pain.  He was advised to increase Benefiber to daily and continue MiraLAX.May use MiraLAX BID if needed.   Today: LLQ discomfort continues. 1/10. No change. Comes and goes. Present more frequently than not. Doesn't wake him at night. BMs daily to every other day. Feels there could still be improvement. MiraLAX daily. Stopped Benefiber as this started causing abdominal cramping. Drinking 1-2 bottles of water daily. Urine is pale yellow to clear. Some fruits and vegetables.   Mild rectal discomfort comes and goes. Maybe a little better than last visit. Used anusol cream for 7 days. Hasn't used it since. States it helped some. No prolapsing hemorrhoid  tissues. No knifelike pain. No rectal bleeding. No black stool. Symptoms occur once a week. Resolves on its own. Trying not to sit on toilet for long periods of times. Sits a lot due to job. No heavy lifting or straining himself.    No N/V, GERD symptoms, or dysphagia.    Past Medical History:  Diagnosis Date  . Allergy   . Hyperlipidemia     History reviewed. No pertinent surgical history.  Current Outpatient Medications  Medication Sig Dispense Refill  . cetirizine (ZYRTEC) 10 MG tablet Take 10 mg by mouth daily.    . Multiple Vitamin (MULTIVITAMIN) tablet Take 1 tablet by mouth daily.    . polyethylene glycol (MIRALAX / GLYCOLAX) 17 g packet Take 17 g by mouth daily.    . pravastatin (PRAVACHOL) 40 MG tablet TAKE 1 TABLET DAILY 30 tablet 0  . hydrocortisone (ANUSOL-HC) 2.5 % rectal cream Place 1 application rectally 2 (two) times daily. (Patient not taking: Reported on 09/08/2019) 30 g 1   No current facility-administered medications for this visit.    Allergies as of 09/08/2019 - Review Complete 09/08/2019  Allergen Reaction Noted  . English plantain Other (See Comments) 03/14/2018    Family History  Problem Relation Age of Onset  . Heart disease Mother   . Cancer Mother        kidney  . Heart disease Father        CABG  . Seizures Father   . Colon cancer Neg Hx     Social History  Socioeconomic History  . Marital status: Married    Spouse name: Not on file  . Number of children: Not on file  . Years of education: Not on file  . Highest education level: Not on file  Occupational History  . Occupation: Warehouse manager  Tobacco Use  . Smoking status: Never Smoker  . Smokeless tobacco: Never Used  Substance and Sexual Activity  . Alcohol use: No  . Drug use: No  . Sexual activity: Yes  Other Topics Concern  . Not on file  Social History Narrative  . Not on file   Social Determinants of Health   Financial Resource Strain:   . Difficulty of Paying Living  Expenses:   Food Insecurity:   . Worried About Programme researcher, broadcasting/film/video in the Last Year:   . Barista in the Last Year:   Transportation Needs:   . Freight forwarder (Medical):   Marland Kitchen Lack of Transportation (Non-Medical):   Physical Activity:   . Days of Exercise per Week:   . Minutes of Exercise per Session:   Stress:   . Feeling of Stress :   Social Connections:   . Frequency of Communication with Friends and Family:   . Frequency of Social Gatherings with Friends and Family:   . Attends Religious Services:   . Active Member of Clubs or Organizations:   . Attends Banker Meetings:   Marland Kitchen Marital Status:     Review of Systems: Gen: Denies fever, chills, cold or flulike symptoms, lightheadedness, dizziness, presyncope, syncope. CV: Denies chest pain or heart palpitations. Resp: Denies dyspnea or cough. GI: See HPI Derm: Denies rash Psych: Denies depression or anxiety Heme: See HPI  Physical Exam: BP 137/90   Pulse 76   Temp (!) 97.5 F (36.4 C) (Temporal)   Ht 6' (1.829 m)   Wt 185 lb 3.2 oz (84 kg)   BMI 25.12 kg/m  General:   Alert and oriented. No distress noted. Pleasant and cooperative.  Head:  Normocephalic and atraumatic. Eyes:  Conjuctiva clear without scleral icterus. Heart:  S1, S2 present without murmurs appreciated. Lungs:  Clear to auscultation bilaterally. No wheezes, rales, or rhonchi. No distress.  Abdomen:  +BS, soft, non-tender and non-distended. No rebound or guarding. No HSM or masses noted. Msk:  Symmetrical without gross deformities. Normal posture. Extremities:  Without edema. Neurologic:  Alert and  oriented x4 Psych: Normal mood and affect.

## 2019-09-08 ENCOUNTER — Other Ambulatory Visit: Payer: Self-pay

## 2019-09-08 ENCOUNTER — Ambulatory Visit: Payer: BC Managed Care – PPO | Admitting: Gastroenterology

## 2019-09-08 ENCOUNTER — Encounter: Payer: Self-pay | Admitting: Gastroenterology

## 2019-09-08 VITALS — BP 137/90 | HR 76 | Temp 97.5°F | Ht 72.0 in | Wt 185.2 lb

## 2019-09-08 DIAGNOSIS — K6289 Other specified diseases of anus and rectum: Secondary | ICD-10-CM

## 2019-09-08 DIAGNOSIS — K5900 Constipation, unspecified: Secondary | ICD-10-CM

## 2019-09-08 DIAGNOSIS — R1032 Left lower quadrant pain: Secondary | ICD-10-CM

## 2019-09-08 DIAGNOSIS — Z1211 Encounter for screening for malignant neoplasm of colon: Secondary | ICD-10-CM

## 2019-09-08 NOTE — Patient Instructions (Addendum)
We will get you scheduled for colonoscopy in the near future with Dr. Jena Gauss.  For constipation, stop MiraLAX and try Linzess 72 mcg.  We will provide samples today.  Take this daily on empty stomach, least 30 minutes before your first meal.  Call in 1-2 weeks with a progress report.  If this works well, I will send in a prescription.  Please note, it is important that your bowels are moving well prior to your colon prep.  Be sure you are drinking enough water to keep your urine pale yellow to clear. Eat plenty of fruits and vegetables daily to maintain adequate fiber intake.  You may try using Anusol cream twice daily as needed to help with rectal discomfort.  We will see you back after your colonoscopy.  Call with questions or concerns prior.  Ermalinda Memos, PA-C [facility]     Constipation, Adult Constipation is when a person:  Poops (has a bowel movement) fewer times in a week than normal.  Has a hard time pooping.  Has poop that is dry, hard, or bigger than normal. Follow these instructions at home: Eating and drinking   Eat foods that have a lot of fiber, such as: ? Fresh fruits and vegetables. ? Whole grains. ? Beans.  Eat less of foods that are high in fat, low in fiber, or overly processed, such as: ? [place] fries. ? Hamburgers. ? Cookies. ? Candy. ? Soda.  Drink enough fluid to keep your pee (urine) clear or pale yellow. General instructions  Exercise regularly or as told by your doctor.  Go to the restroom when you feel like you need to poop. Do not hold it in.  Take over-the-counter and prescription medicines only as told by your doctor. These include any fiber supplements.  Do pelvic floor retraining exercises, such as: ? Doing deep breathing while relaxing your lower belly (abdomen). ? Relaxing your pelvic floor while pooping.  Watch your condition for any changes.  Keep all follow-up visits as told by your doctor. This is  important. Contact a doctor if:  You have pain that gets worse.  You have a fever.  You have not pooped for 4 days.  You throw up (vomit).  You are not hungry.  You lose weight.  You are bleeding from the anus.  You have thin, pencil-like poop (stool). Get help right away if:  You have a fever, and your symptoms suddenly get worse.  You leak poop or have blood in your poop.  Your belly feels hard or bigger than normal (is bloated).  You have very bad belly pain.  You feel dizzy or you faint. This information is not intended to replace advice given to you by your health care provider. Make sure you discuss any questions you have with your health care provider. Document Revised: 04/06/2017 Document Reviewed: 10/13/2015 Elsevier Patient Education  2020 [employer].

## 2019-09-12 NOTE — Assessment & Plan Note (Addendum)
Chronic, intermittent, very mild (1/10 in severity) LLQ discomfort.  Present more frequently than not.  No known aggravating or relieving factors.  Prior evaluation with DG abdomen September 2020 revealing phleboliths in the pelvis, otherwise unremarkable.  Pelvic ultrasound October 2020 without significant findings.  CT A/P with contrast January 2021 without acute findings.  He did have retained stool in the rectum and throughout the colon.  He was already taking MiraLAX daily and Benefiber a couple days a week for constipation.  Tried increasing Benefiber to daily but this caused abdominal cramping.  Currently with BMs daily to every other day but still feels there could be improvement in his bowel movements.  Denies bright red blood per rectum, melena, unintentional weight loss.  No family history of colon cancer.  Etiology of LLQ pain is not clear.  Suspect mild constipation could still be playing a role.  Stop MiraLAX and trial Linzess 72 mcg daily 30 minutes before first meal.  Samples provided.  Requested 1-2-week progress report. Drink enough water to keep urine pale yellow to clear. Eat plenty of fruits and vegetables daily to maintain adequate fiber intake. Constipation handout provided. As patient will be 49 in the next month, will go ahead and arrange colonoscopy in the near future with Dr. Jena Gauss.  This will also help evaluate for any other etiology of LLQ pain.  The risks, benefits, and alternatives have been discussed in detail with patient. They have stated understanding and desire to proceed.  Follow-up after colonoscopy.

## 2019-09-12 NOTE — Assessment & Plan Note (Signed)
49 year old male who will be turning 49 in 1 month and will be due for first ever screening colonoscopy.  Lower GI symptoms include mild constipation, chronic, very mild, intermittent LLQ abdominal pain, and very mild intermittent rectal discomfort.  Denies bright red blood per rectum, melena, unintentional weight loss.  No family history of colon cancer.  Prior evaluation for abdominal pain has included DG abdomen, pelvic ultrasound, and CT A/P that have been unrevealing.  Rectal exam in December with small internal hemorrhoids.  Used Anusol cream which helped some with rectal discomfort.  Currently taking MiraLAX for constipation with BMs daily to every other day but still feels there could be improvement.  Suspect constipation is contributing to mild LLQ pain and mild rectal discomfort.  However, proceeding with first ever screening colonoscopy will also help evaluate this further.  Proceed with colonoscopy with Dr. Jena Gauss in the near future. The risks, benefits, and alternatives have been discussed in detail with patient. They have stated understanding and desire to proceed.  Stop MiraLAX and trial Linzess 72 mcg daily 30 minutes before first meal.  Samples provided.  Requested 1-2-week progress report.  Explained the importance of bowels moving well prior to colon prep. Drink enough water to keep urine pale yellow to clear. Eat plenty of fruits and vegetables daily to maintain adequate fiber intake. Constipation handout provided. Resume Anusol twice daily as needed for rectal pain. Follow-up after colonoscopy.

## 2019-09-12 NOTE — Assessment & Plan Note (Addendum)
Constipation improved with MiraLAX.  Currently with BMs daily to every other day but still feels this there could be improvement.  He also continues with very mild intermittent LLQ abdominal pain of unidentified etiology with prior evaluation including DG abdomen, pelvic ultrasound, and CT A/P with contrast that have been unrevealing.  Also with mild rectal discomfort with small internal hemorrhoids on rectal exam in December 2020.  Suspect constipation may be contributing to LLQ pain and rectal discomfort.  Denies bright red blood per rectum, melena, unintentional weight loss.  No prior colonoscopy.  Stop MiraLAX and trial Linzess 72 mcg daily 30 minutes before first meal.  Samples provided.  Requested 1-2-week progress report. Drink enough water to keep urine pale yellow to clear. Eat plenty of fruits and vegetables daily to maintain adequate fiber intake. Constipation handout provided. As patient will be 49 in the next month, will go ahead and arrange colonoscopy in the near future with Dr. Jena Gauss.  This will also help evaluate for any other etiology of LLQ pain.  The risks, benefits, and alternatives have been discussed in detail with patient. They have stated understanding and desire to proceed.  Follow-up after colonoscopy.

## 2019-09-12 NOTE — Assessment & Plan Note (Addendum)
Intermittent mild rectal discomfort for the last several months.  This is in the setting of mild constipation and sitting for several hours a day at work.  Prior rectal exam in December with what was felt to be small internal hemorrhoids as likely source of rectal discomfort.  Some improvement after using Anusol cream twice daily for 7 days back in December but has not used this since.  Denies knifelike pain, bright red blood per rectum, melena, prolapsing hemorrhoid tissue.  He was advised to try using Anusol cream again twice daily as needed when rectal pain occurs. As he will also be 49 in the next month, we will go ahead and arrange colonoscopy with Dr. Jena Gauss in the near future which will help evaluate for any other etiology of rectal pain.   For constipation, he will stop MiraLAX and trial Linzess 72 mcg daily 30 minutes before first meal.  Samples provided.  Requested 1-2-week progress report. Follow-up after colonoscopy.

## 2019-09-18 ENCOUNTER — Other Ambulatory Visit: Payer: Self-pay | Admitting: Gastroenterology

## 2019-09-18 ENCOUNTER — Telehealth: Payer: Self-pay

## 2019-09-18 DIAGNOSIS — K5900 Constipation, unspecified: Secondary | ICD-10-CM

## 2019-09-18 MED ORDER — LINACLOTIDE 72 MCG PO CAPS: 72.0000 ug | ORAL_CAPSULE | Freq: Every day | ORAL | 5 refills | Status: DC

## 2019-09-18 NOTE — Telephone Encounter (Signed)
I will send in Rx. He can let us know if it is too expensive or if he ends up feeling medication is too strong.

## 2019-09-18 NOTE — Telephone Encounter (Signed)
Called and informed pt.  

## 2019-09-18 NOTE — Telephone Encounter (Signed)
Pt called office, he has been taking Linzess daily. Takes med at 8:30am, then eats at 9:00am. Has been having one loose BM/day around 10:30am-11:00am. No abdominal pain. He is ok with Linzess rx if [facility] advises. He uses [facility] in [place].

## 2019-09-19 ENCOUNTER — Ambulatory Visit: Payer: BC Managed Care – PPO | Attending: Internal Medicine

## 2019-09-19 DIAGNOSIS — Z23 Encounter for immunization: Secondary | ICD-10-CM

## 2019-09-19 NOTE — Progress Notes (Signed)
   Covid-19 Vaccination Clinic  Name:  IGOR BISHOP    MRN: [id number] DOB: [birth year]  09/19/2019  Mr. Byers was observed post Covid-19 immunization for 15 minutes without incident. He was provided with Vaccine Information Sheet and instruction to access the V-Safe system.   Mr. Mormile was instructed to call 911 with any severe reactions post vaccine: Marland Kitchen Difficulty breathing  . Swelling of face and throat  . A fast heartbeat  . A bad rash all over body  . Dizziness and weakness   Immunizations Administered    Name Date Dose VIS Date Route   Pfizer COVID-19 Vaccine 09/19/2019  3:21 PM 0.3 mL 07/02/2018 Intramuscular   Manufacturer: [employer], [employer]   Lot: [id number]   NDC: [address]-1

## 2019-09-22 ENCOUNTER — Ambulatory Visit: Payer: BC Managed Care – PPO | Admitting: Gastroenterology

## 2019-10-10 ENCOUNTER — Ambulatory Visit: Payer: BC Managed Care – PPO | Attending: Internal Medicine

## 2019-10-10 DIAGNOSIS — Z23 Encounter for immunization: Secondary | ICD-10-CM

## 2019-10-10 NOTE — Progress Notes (Signed)
   Covid-19 Vaccination Clinic  Name:  NYKEEM CITRO    MRN: [id number] DOB: 1971/04/17  10/10/2019  Mr. Fiorella was observed post Covid-19 immunization for 15 minutes without incident. He was provided with Vaccine Information Sheet and instruction to access the V-Safe system.   Mr. Kirsh was instructed to call 911 with any severe reactions post vaccine: Marland Kitchen Difficulty breathing  . Swelling of face and throat  . A fast heartbeat  . A bad rash all over body  . Dizziness and weakness   Immunizations Administered    Name Date Dose VIS Date Route   Pfizer COVID-19 Vaccine 10/10/2019  3:45 PM 0.3 mL 07/02/2018 Intramuscular   Manufacturer: [employer], [employer]   Lot: [id number]   NDC: [address]-5

## 2019-11-19 ENCOUNTER — Other Ambulatory Visit (HOSPITAL_COMMUNITY)
Admission: RE | Admit: 2019-11-19 | Discharge: 2019-11-19 | Disposition: A | Discharge status: Home / Self Care | Payer: BC Managed Care – PPO | Source: Ambulatory Visit | Attending: Internal Medicine | Admitting: Internal Medicine

## 2019-11-19 ENCOUNTER — Other Ambulatory Visit: Payer: Self-pay

## 2019-11-19 DIAGNOSIS — Z01812 Encounter for preprocedural laboratory examination: Secondary | ICD-10-CM | POA: Insufficient documentation

## 2019-11-19 DIAGNOSIS — Z20822 Contact with and (suspected) exposure to covid-19: Secondary | ICD-10-CM | POA: Insufficient documentation

## 2019-11-19 LAB — SARS CORONAVIRUS 2 (TAT 6-24 HRS): SARS Coronavirus 2: NEGATIVE

## 2019-11-21 ENCOUNTER — Other Ambulatory Visit: Payer: Self-pay

## 2019-11-21 ENCOUNTER — Encounter (HOSPITAL_COMMUNITY)
Admission: RE | Disposition: A | Discharge status: Home / Self Care | Payer: Self-pay | Source: Home / Self Care | Attending: Internal Medicine

## 2019-11-21 ENCOUNTER — Ambulatory Visit (HOSPITAL_COMMUNITY)
Admission: RE | Admit: 2019-11-21 | Discharge: 2019-11-21 | Disposition: A | Discharge status: Home / Self Care | Payer: BC Managed Care – PPO | Attending: Internal Medicine | Admitting: Internal Medicine

## 2019-11-21 ENCOUNTER — Encounter (HOSPITAL_COMMUNITY): Payer: Self-pay | Admitting: Internal Medicine

## 2019-11-21 DIAGNOSIS — Z820 Family history of epilepsy and other diseases of the nervous system: Secondary | ICD-10-CM | POA: Insufficient documentation

## 2019-11-21 DIAGNOSIS — Z8051 Family history of malignant neoplasm of kidney: Secondary | ICD-10-CM | POA: Insufficient documentation

## 2019-11-21 DIAGNOSIS — Z87442 Personal history of urinary calculi: Secondary | ICD-10-CM | POA: Diagnosis not present

## 2019-11-21 DIAGNOSIS — Z1211 Encounter for screening for malignant neoplasm of colon: Secondary | ICD-10-CM | POA: Insufficient documentation

## 2019-11-21 DIAGNOSIS — Z8249 Family history of ischemic heart disease and other diseases of the circulatory system: Secondary | ICD-10-CM | POA: Insufficient documentation

## 2019-11-21 DIAGNOSIS — Z791 Long term (current) use of non-steroidal anti-inflammatories (NSAID): Secondary | ICD-10-CM | POA: Diagnosis not present

## 2019-11-21 DIAGNOSIS — E785 Hyperlipidemia, unspecified: Secondary | ICD-10-CM | POA: Diagnosis not present

## 2019-11-21 DIAGNOSIS — Z79899 Other long term (current) drug therapy: Secondary | ICD-10-CM | POA: Insufficient documentation

## 2019-11-21 HISTORY — DX: Personal history of urinary calculi: Z87.442

## 2019-11-21 HISTORY — PX: COLONOSCOPY: SHX5424

## 2019-11-21 SURGERY — COLONOSCOPY
Anesthesia: Moderate Sedation

## 2019-11-21 MED ORDER — ONDANSETRON HCL 4 MG/2ML IJ SOLN
INTRAMUSCULAR | Status: AC
  Filled 2019-11-21: qty 2

## 2019-11-21 MED ORDER — SODIUM CHLORIDE 0.9 % IV SOLN
INTRAVENOUS | Status: DC
  Administered 2019-11-21: 1000 mL via INTRAVENOUS

## 2019-11-21 MED ORDER — STERILE WATER FOR IRRIGATION IR SOLN
Status: DC | PRN
  Administered 2019-11-21: 1.5 mL

## 2019-11-21 MED ORDER — MIDAZOLAM HCL 5 MG/5ML IJ SOLN
INTRAMUSCULAR | Status: DC | PRN
  Administered 2019-11-21: 2 mg via INTRAVENOUS
  Administered 2019-11-21: 1 mg via INTRAVENOUS
  Administered 2019-11-21: 2 mg via INTRAVENOUS

## 2019-11-21 MED ORDER — MIDAZOLAM HCL 5 MG/5ML IJ SOLN
INTRAMUSCULAR | Status: AC
  Filled 2019-11-21: qty 10

## 2019-11-21 MED ORDER — ONDANSETRON HCL 4 MG/2ML IJ SOLN
INTRAMUSCULAR | Status: DC | PRN
  Administered 2019-11-21: 4 mg via INTRAVENOUS

## 2019-11-21 MED ORDER — MEPERIDINE HCL 100 MG/ML IJ SOLN
INTRAMUSCULAR | Status: DC | PRN
  Administered 2019-11-21 (×2): 25 mg via INTRAVENOUS

## 2019-11-21 MED ORDER — MEPERIDINE HCL 50 MG/ML IJ SOLN
INTRAMUSCULAR | Status: AC
  Filled 2019-11-21: qty 1

## 2019-11-21 NOTE — Op Note (Signed)
[facility] Patient Name: Jose Golden Procedure Date: 11/21/2019 1:01 PM MRN: [id number] Date of Birth: 07/15/1970 Attending MD: Gennette Pac , MD CSN: [id number] Age: 49 Admit Type: Outpatient Procedure:                Colonoscopy Indications:              Screening for colorectal malignant neoplasm Providers:                Gennette Pac, MD, Criselda Peaches. Patsy Lager, RN,                            Edythe Clarity, Technician Referring MD:              Medicines:                Midazolam 6 mg IV, Meperidine 50 mg IV Complications:            No immediate complications. Estimated Blood Loss:     Estimated blood loss was minimal. Procedure:                Pre-Anesthesia Assessment:                           - Prior to the procedure, a History and Physical                            was performed, and patient medications and                            allergies were reviewed. The patient's tolerance of                            previous anesthesia was also reviewed. The risks                            and benefits of the procedure and the sedation                            options and risks were discussed with the patient.                            All questions were answered, and informed consent                            was obtained. Prior Anticoagulants: The patient has                            taken no previous anticoagulant or antiplatelet                            agents. ASA Grade Assessment: II - A patient with                            mild systemic disease. After reviewing the risks  and benefits, the patient was deemed in                            satisfactory condition to undergo the procedure.                           After obtaining informed consent, the colonoscope                            was passed under direct vision. Throughout the                            procedure, the patient's blood pressure, pulse, and                             oxygen saturations were monitored continuously. The                            [id number] ([id number]) scope was introduced through                            the anus and advanced to the the cecum, identified                            by appendiceal orifice and ileocecal valve. The                            colonoscopy was performed without difficulty. The                            patient tolerated the procedure well. The quality                            of the bowel preparation was adequate. The                            ileocecal valve, appendiceal orifice, and rectum                            were photographed. The entire colon was well                            visualized. Scope In: 1:19:48 PM Scope Out: 1:32:53 PM Scope Withdrawal Time: 0 hours 8 minutes 2 seconds  Total Procedure Duration: 0 hours 13 minutes 5 seconds  Findings:      The perianal and digital rectal examinations were normal.      The colon (entire examined portion) appeared normal.      The retroflexed view of the distal rectum and anal verge was normal and       showed no anal or rectal abnormalities. Impression:               - The entire examined colon is normal.                           -  The distal rectum and anal verge are normal on                            retroflexion view.                           - No specimens collected. Moderate Sedation:      Moderate (conscious) sedation was administered by the endoscopy nurse       and supervised by the endoscopist. The following parameters were       monitored: oxygen saturation, heart rate, blood pressure, respiratory       rate, EKG, adequacy of pulmonary ventilation, and response to care.       Total physician intraservice time was 22 minutes. Recommendation:           - Patient has a contact number available for                            emergencies. The signs and symptoms of potential                            delayed complications  were discussed with the                            patient. Return to normal activities tomorrow.                            Written discharge instructions were provided to the                            patient.                           - Resume previous diet.                           - Continue present medications.                           - Repeat colonoscopy in 10 years for screening                            purposes.                           - Return to GI office PRN. Procedure Code(s):        --- Professional ---                           [phone], Colonoscopy, flexible; diagnostic, including                            collection of specimen(s) by brushing or washing,                            when performed (separate procedure)  G0500, Moderate sedation services provided by the                            same physician or other qualified health care                            professional performing a gastrointestinal                            endoscopic service that sedation supports,                            requiring the presence of an independent trained                            observer to assist in the monitoring of the                            patient's level of consciousness and physiological                            status; initial 15 minutes of intra-service time;                            patient age 38 years or older (additional time may                            be reported with [id number], as appropriate) Diagnosis Code(s):        --- Professional ---                           Z12.11, Encounter for screening for malignant                            neoplasm of colon CPT copyright 2019 American Medical Association. All rights reserved. The codes documented in this report are preliminary and upon coder review may  be revised to meet current compliance requirements. Gerrit Friends. Haileyann Staiger, MD Gennette Pac, MD 11/21/2019 1:44:01  PM This report has been signed electronically. Number of Addenda: 0

## 2019-11-21 NOTE — Discharge Instructions (Signed)
  Colonoscopy Discharge Instructions  Read the instructions outlined below and refer to this sheet in the next few weeks. These discharge instructions provide you with general information on caring for yourself after you leave the hospital. Your doctor may also give you specific instructions. While your treatment has been planned according to the most current medical practices available, unavoidable complications occasionally occur. If you have any problems or questions after discharge, call Dr. Jena Gauss at [phone]. ACTIVITY  You may resume your regular activity, but move at a slower pace for the next 24 hours.   Take frequent rest periods for the next 24 hours.   Walking will help get rid of the air and reduce the bloated feeling in your belly (abdomen).   No driving for 24 hours (because of the medicine (anesthesia) used during the test).    Do not sign any important legal documents or operate any machinery for 24 hours (because of the anesthesia used during the test).  NUTRITION  Drink plenty of fluids.   You may resume your normal diet as instructed by your doctor.   Begin with a light meal and progress to your normal diet. Heavy or fried foods are harder to digest and may make you feel sick to your stomach (nauseated).   Avoid alcoholic beverages for 24 hours or as instructed.  MEDICATIONS  You may resume your normal medications unless your doctor tells you otherwise.  WHAT YOU CAN EXPECT TODAY  Some feelings of bloating in the abdomen.   Passage of more gas than usual.   Spotting of blood in your stool or on the toilet paper.  IF YOU HAD POLYPS REMOVED DURING THE COLONOSCOPY:  No aspirin products for 7 days or as instructed.   No alcohol for 7 days or as instructed.   Eat a soft diet for the next 24 hours.  FINDING OUT THE RESULTS OF YOUR TEST Not all test results are available during your visit. If your test results are not back during the visit, make an appointment  with your caregiver to find out the results. Do not assume everything is normal if you have not heard from your caregiver or the medical facility. It is important for you to follow up on all of your test results.  SEEK IMMEDIATE MEDICAL ATTENTION IF:  You have more than a spotting of blood in your stool.   Your belly is swollen (abdominal distention).   You are nauseated or vomiting.   You have a temperature over 101.   You have abdominal pain or discomfort that is severe or gets worse throughout the day.    Your colonoscopy was normal today  I recommend a repeat screening colonoscopy in 10 years  At patient request, I called Byrant Valent at [phone] reviewed results

## 2019-11-21 NOTE — H&P (Signed)
@LOGO @   Primary Care Physician:  , FNP Primary Gastroenterologist:  Dr. Junie Spencer  Pre-Procedure History & Physical: HPI:  Jose Golden is a 49 y.o. male is here for a screening colonoscopy.  No GI symptoms currently.  No prior colonoscopy.  No family history of colon cancer.  Past Medical History:  Diagnosis Date  . Allergy   . History of kidney stones   . Hyperlipidemia     Past Surgical History:  Procedure Laterality Date  . LITHOTRIPSY      Prior to Admission medications   Medication Sig Start Date End Date Taking? Authorizing Provider  cetirizine (ZYRTEC) 10 MG tablet Take 10 mg by mouth every evening.    Yes [provider]  Cholecalciferol (VITAMIN D3 PO) Take 1 tablet by mouth at bedtime.   Yes [provider]  fluticasone (FLONASE) 50 MCG/ACT nasal spray Place 1-2 sprays into both nostrils daily as needed (congestion.).   Yes [provider]  ibuprofen (ADVIL) 200 MG tablet Take 200-400 mg by mouth every 8 (eight) hours as needed (pain).   Yes [provider]  linaclotide 44) 72 MCG capsule Take 1 capsule (72 mcg total) by mouth daily before breakfast. 09/18/19  Yes 09/20/19, PA-C  Multiple Vitamin (MULTIVITAMIN WITH MINERALS) TABS tablet Take 1 tablet by mouth at bedtime.   Yes [provider]  pravastatin (PRAVACHOL) 40 MG tablet TAKE 1 TABLET DAILY Patient taking differently: Take 40 mg by mouth at bedtime.  03/13/14  Yes 13/6/15, MD  hydrocortisone (ANUSOL-HC) 2.5 % rectal cream Place 1 application rectally 2 (two) times daily. Patient taking differently: Place 1 application rectally 2 (two) times daily as needed for hemorrhoids.  04/24/19   04/26/19, PA-C    Allergies as of 09/09/2019 - Review Complete 09/08/2019  Allergen Reaction Noted  . English plantain Other (See Comments) 03/14/2018    Family History  Problem Relation Age of Onset  . Heart disease Mother   . Cancer  Mother        kidney  . Heart disease Father        CABG  . Seizures Father   . Colon cancer Neg Hx     Social History   Socioeconomic History  . Marital status: Married    Spouse name: Not on file  . Number of children: Not on file  . Years of education: Not on file  . Highest education level: Not on file  Occupational History  . Occupation: 13/11/2017  Tobacco Use  . Smoking status: Never Smoker  . Smokeless tobacco: Never Used  Vaping Use  . Vaping Use: Never used  Substance and Sexual Activity  . Alcohol use: No  . Drug use: No  . Sexual activity: Yes  Other Topics Concern  . Not on file  Social History Narrative  . Not on file   Social Determinants of Health   Financial Resource Strain:   . Difficulty of Paying Living Expenses:   Food Insecurity:   . Worried About Warehouse manager in the Last Year:   . Programme researcher, broadcasting/film/video in the Last Year:   Transportation Needs:   . Barista (Medical):   Freight forwarder Lack of Transportation (Non-Medical):   Physical Activity:   . Days of Exercise per Week:   . Minutes of Exercise per Session:   Stress:   . Feeling of Stress :   Social Connections:   . Frequency  of Communication with Friends and Family:   . Frequency of Social Gatherings with Friends and Family:   . Attends Religious Services:   . Active Member of Clubs or Organizations:   . Attends Banker Meetings:   Marland Kitchen Marital Status:   Intimate Partner Violence:   . Fear of Current or Ex-Partner:   . Emotionally Abused:   Marland Kitchen Physically Abused:   . Sexually Abused:     Review of Systems: See HPI, otherwise negative ROS  Physical Exam: BP (!) 132/98   Pulse (!) 105   Temp 99.1 F (37.3 C) (Oral)   Resp 13   Ht 5\' 11"  (1.803 m)   Wt 81.6 kg   SpO2 98%   BMI 25.10 kg/m  General:   Alert,  Well-developed, well-nourished, pleasant and cooperative in NAD Neck:  Supple; no masses or thyromegaly. Lungs:  Clear throughout to auscultation.   No  wheezes, crackles, or rhonchi. No acute distress. Heart:  Regular rate and rhythm; no murmurs, clicks, rubs,  or gallops. Abdomen:  Soft, nontender and nondistended. No masses, hepatosplenomegaly or hernias noted. Normal bowel sounds, without guarding, and without rebound.    Impression/Plan: [name] is now here to undergo a screening colonoscopy.  First ever average screening examination.  Risks, benefits, limitations, imponderables and alternatives regarding colonoscopy have been reviewed with the patient. Questions have been answered. All parties agreeable.     Notice:  This dictation was prepared with Dragon dictation along with smaller phrase technology. Any transcriptional errors that result from this process are unintentional and may not be corrected upon review.

## 2019-11-26 ENCOUNTER — Encounter (HOSPITAL_COMMUNITY): Payer: Self-pay | Admitting: Internal Medicine

## 2020-02-18 DIAGNOSIS — Z23 Encounter for immunization: Secondary | ICD-10-CM | POA: Diagnosis not present

## 2020-04-18 DIAGNOSIS — Z20822 Contact with and (suspected) exposure to covid-19: Secondary | ICD-10-CM | POA: Diagnosis not present

## 2020-04-21 ENCOUNTER — Ambulatory Visit (INDEPENDENT_AMBULATORY_CARE_PROVIDER_SITE_OTHER): Payer: BC Managed Care – PPO | Admitting: Family

## 2020-04-21 ENCOUNTER — Encounter: Payer: Self-pay | Admitting: Family

## 2020-04-21 DIAGNOSIS — U071 COVID-19: Secondary | ICD-10-CM | POA: Diagnosis not present

## 2020-04-21 MED ORDER — DEXAMETHASONE 6 MG PO TABS: 6.0000 mg | ORAL_TABLET | Freq: Two times a day (BID) | ORAL | 0 refills | Status: DC

## 2020-04-21 MED ORDER — ALBUTEROL SULFATE HFA 108 (90 BASE) MCG/ACT IN AERS: 2.0000 | INHALATION_SPRAY | Freq: Four times a day (QID) | RESPIRATORY_TRACT | 2 refills | Status: DC | PRN

## 2020-04-21 NOTE — Progress Notes (Signed)
Virtual Visit via telephone Note Due to COVID-19 pandemic this visit was conducted virtually. This visit type was conducted due to national recommendations for restrictions regarding the COVID-19 Pandemic (e.g. social distancing, sheltering in place) in an effort to limit this patient's exposure and mitigate transmission in our community. All issues noted in this document were discussed and addressed.  A physical exam was not performed with this format.  I connected with Jose Golden on 04/21/20 at 2:50 pm by telephone and verified that I am speaking with the correct person using two identifiers. Jose Golden is currently located at home and no one is currently with him  during visit. The provider, Jannifer Rodney, FNP is located in their office at time of visit.  I discussed the limitations, risks, security and privacy concerns of performing an evaluation and management service by telephone and the availability of in person appointments. I also discussed with the patient that there may be a patient responsible charge related to this service. The patient expressed understanding and agreed to proceed.   History and Present Illness:  PT calls the office today cough and COVID positive. He reports his cough started 04/16/20 and took at home COVID test that was positive and went to Urgent Care on 04/18/20 and had a positive COVID test. He has had two COVID vaccines.  Cough This is a new problem. The current episode started in the past 7 days. The problem has been waxing and waning. The problem occurs every few minutes. The cough is non-productive. Associated symptoms include headaches, myalgias, a sore throat and shortness of breath. Pertinent negatives include no chills, ear congestion, ear pain, fever, nasal congestion or wheezing. He has tried rest for the symptoms. The treatment provided mild relief.      Review of Systems  Constitutional: Negative for chills and fever.  HENT: Positive for sore  throat. Negative for ear pain.   Respiratory: Positive for cough and shortness of breath. Negative for wheezing.   Musculoskeletal: Positive for myalgias.  Neurological: Positive for headaches.     Observations/Objective: No SOB or distress noted   Assessment and Plan: 1. COVID-19 virus detected Rest Force fluids Quarantine Call if symptoms worsen or do not improve   - dexamethasone (DECADRON) 6 MG tablet; Take 1 tablet (6 mg total) by mouth 2 (two) times daily.  Dispense: 10 tablet; Refill: 0 - MyChart COVID-19 home monitoring program; Future - albuterol (VENTOLIN HFA) 108 (90 Base) MCG/ACT inhaler; Inhale 2 puffs into the lungs every 6 (six) hours as needed for wheezing or shortness of breath.  Dispense: 8 g; Refill: 2     I discussed the assessment and treatment plan with the patient. The patient was provided an opportunity to ask questions and all were answered. The patient agreed with the plan and demonstrated an understanding of the instructions.   The patient was advised to call back or seek an in-person evaluation if the symptoms worsen or if the condition fails to improve as anticipated.  The above assessment and management plan was discussed with the patient. The patient verbalized understanding of and has agreed to the management plan. Patient is aware to call the clinic if symptoms persist or worsen. Patient is aware when to return to the clinic for a follow-up visit. Patient educated on when it is appropriate to go to the emergency department.   Time call ended:  3:02 pm   I provided 12 minutes of non-face-to-face time during this encounter.  Evelina Dun, FNP

## 2020-04-28 ENCOUNTER — Telehealth: Payer: Self-pay

## 2020-04-28 NOTE — Telephone Encounter (Signed)
Phone call to pt. To follow up on MyChart Questionnaire that triggered BPA for c/o new onset weakness.  Pt. Stated he just feels "blah".  Denied feeling light headed.  Able to stand and walk.  Advised on importance of changing positions gradually, increasing oral fluid intake, and  gradual increase in mobility, to regain his strength.  Suggested to sit-up in chair for intervals, or to walk about inside his house, approx. 3 times/ day.  Reported he continues to have an appetite. Denied nausea, vomiting, or diarrhea.  Stated he has some drainage in back of throat, that is irritating.  Suggested to take an OTC antihistamine, to decrease the post nasal drip.  Pt. Verb. Understanding of instructions.  Also verb. Appreciation for nurse calling him.

## 2020-10-19 IMAGING — DX DG ABDOMEN 1V
2 series · 2 of 2 positions shown · non-contrast
Comparison: None.

CLINICAL DATA: Acute onset LEFT LOWER QUADRANT abdominal pain
associated with diarrhea.

EXAM:
ABDOMEN - 1 VIEW

[abdomen kub (1 of 2)]
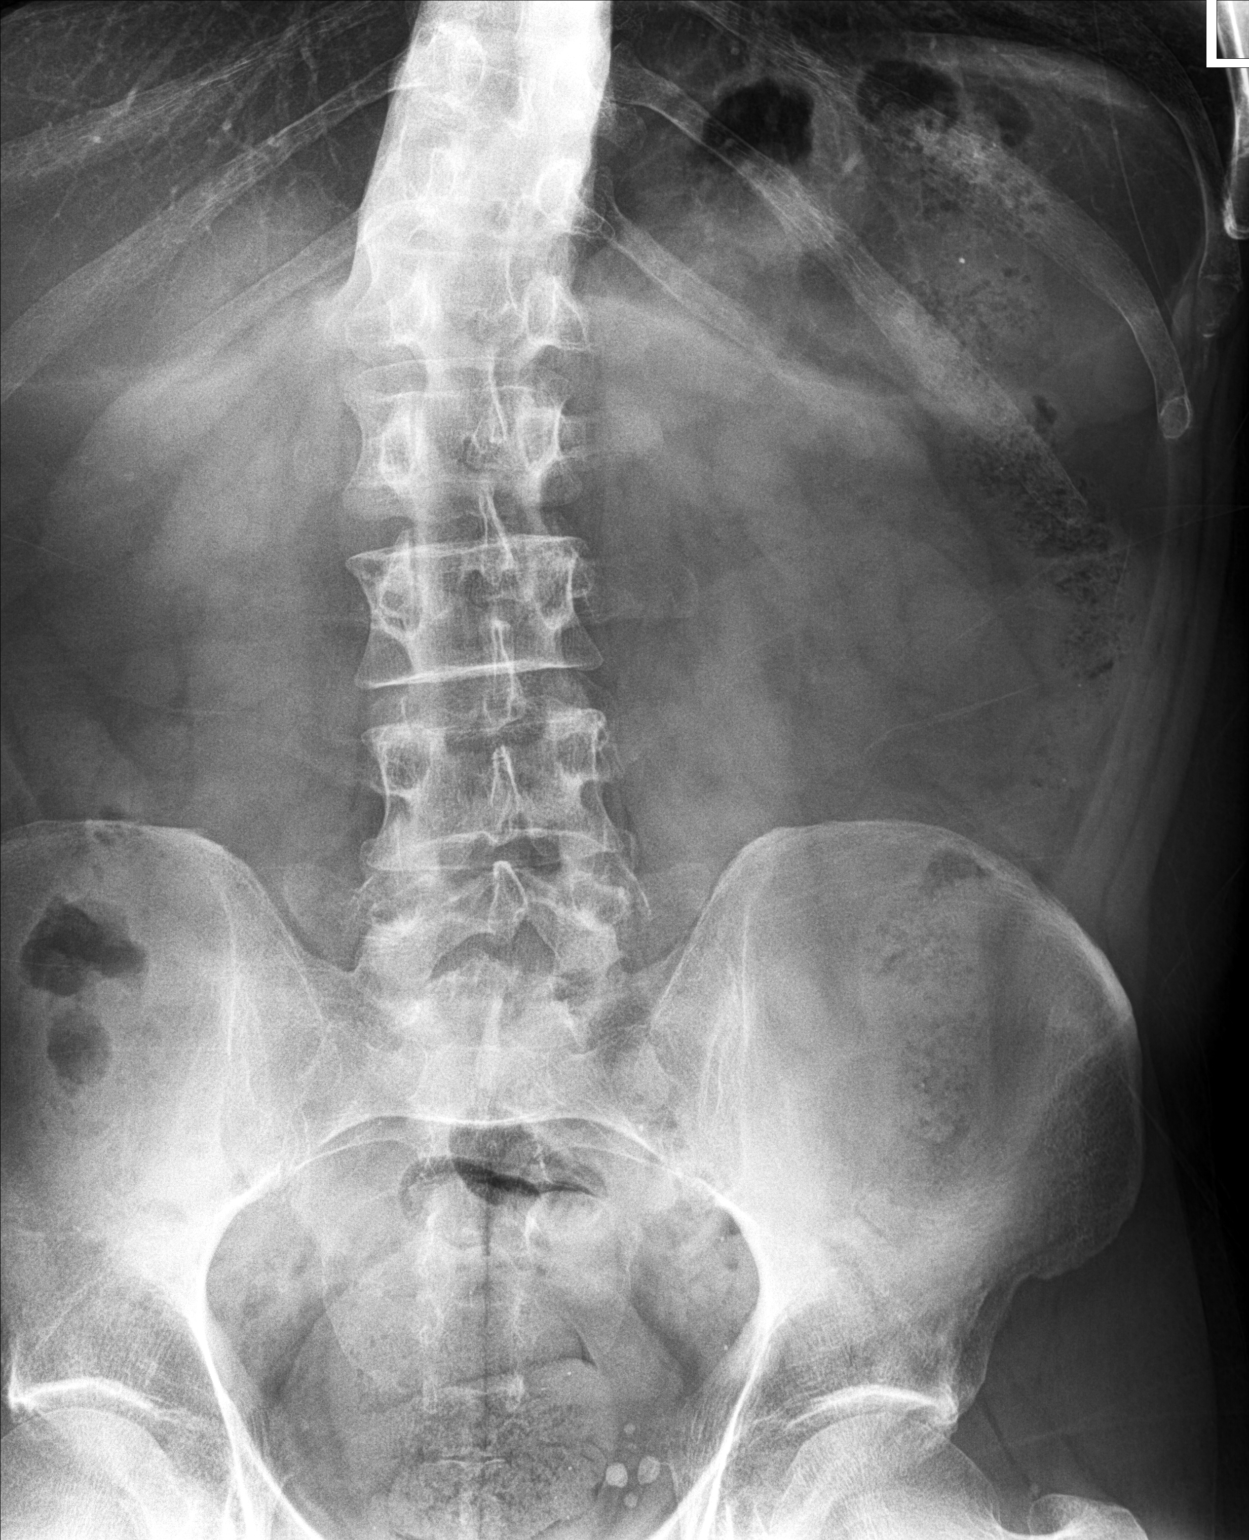

[abdomen kub (2 of 2)]
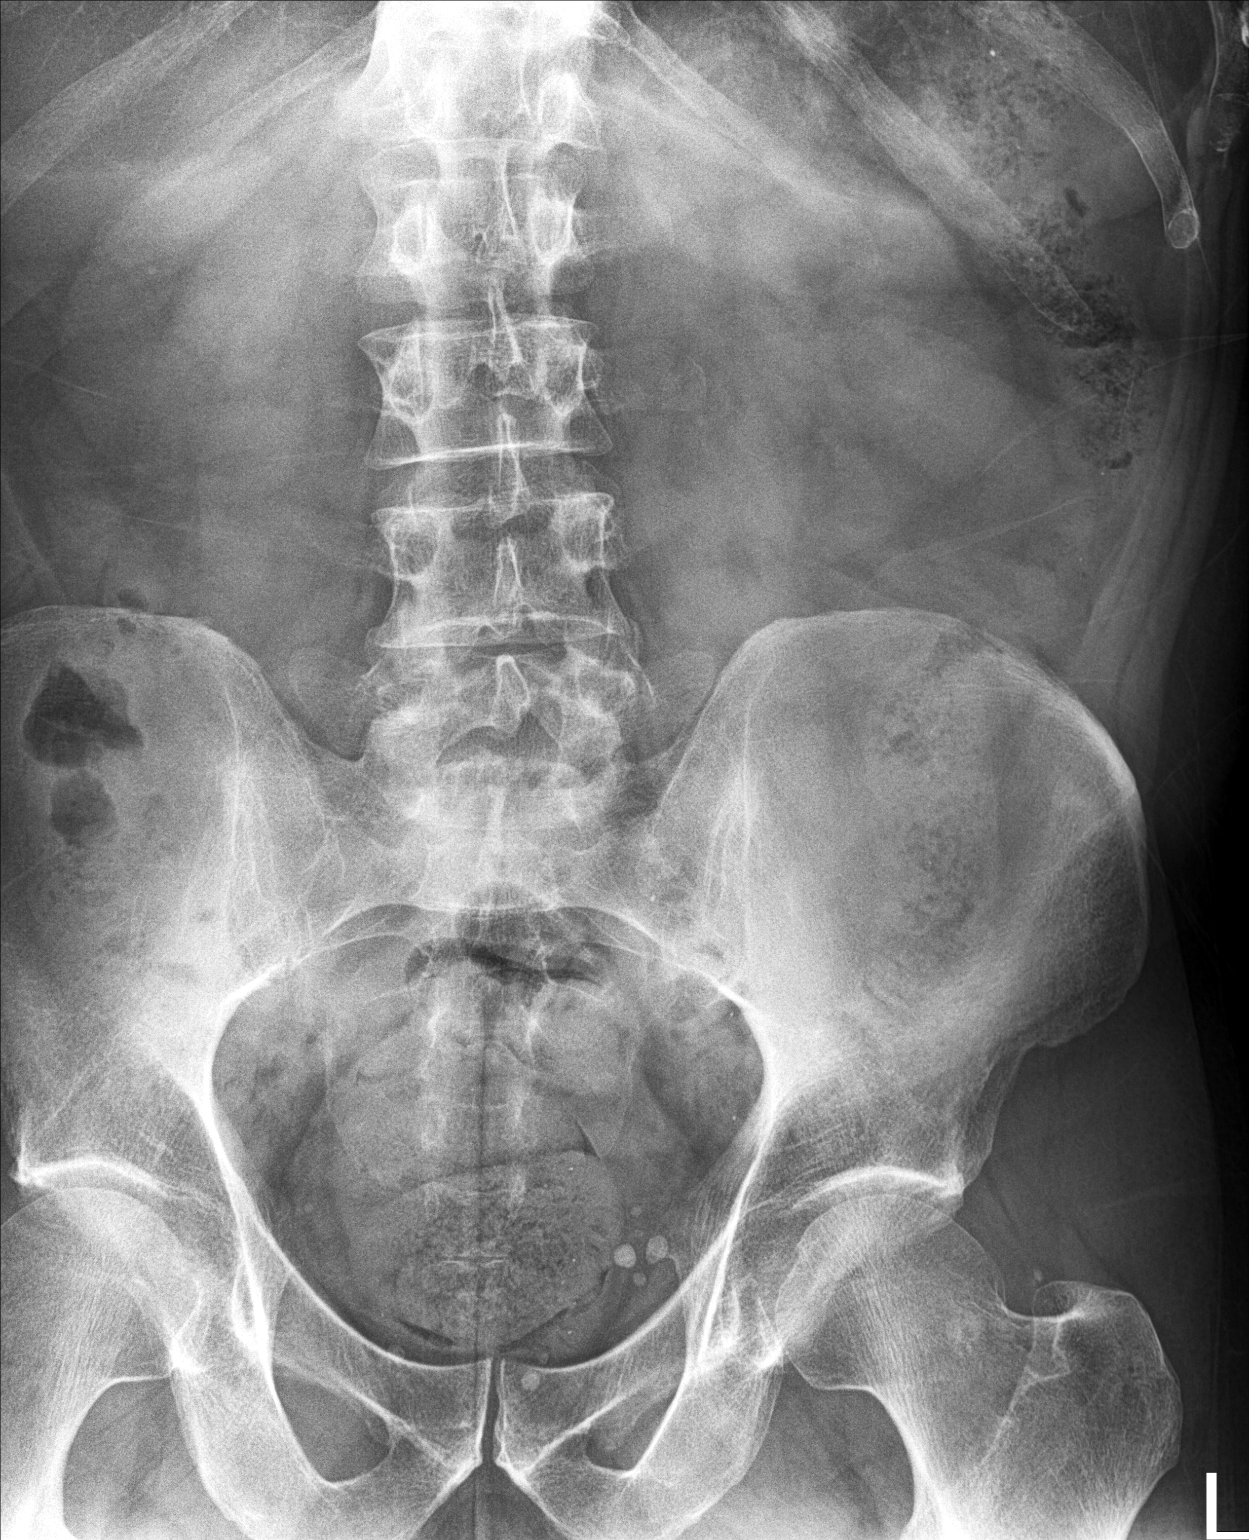

[2 of 2 positions shown; findings below may reference images not displayed]

FINDINGS: Bowel gas pattern unremarkable without evidence of obstruction or
significant ileus. Moderate stool burden in the colon. Very small
calcification projected over the UPPER pole of the RIGHT kidney.
Phleboliths in both sides of the pelvis, LEFT greater than RIGHT.
Thoracolumbar dextroscoliosis.
IMPRESSION: 1. No acute abdominal abnormality.  Moderate colonic stool burden.
2. Very small RIGHT UPPER pole renal calculus.

## 2021-02-16 DIAGNOSIS — Z23 Encounter for immunization: Secondary | ICD-10-CM | POA: Diagnosis not present

## 2021-12-20 IMAGING — CT CT ABD-PELV W/ CM
2 of 5 series · 16 of 46 positions shown, 18 images · IV contrast (Omnipaque or Isovue)
Comparison: None.

CLINICAL DATA: Left lower abdominal pain

EXAM:
CT ABDOMEN AND PELVIS WITH CONTRAST
TECHNIQUE: Multidetector CT imaging of the abdomen and pelvis was performed
using the standard protocol following bolus administration of
intravenous contrast. Oral contrast was also administered.
CONTRAST:  100mL OMNIPAQUE IOHEXOL 300 MG/ML  SOLN

[Series 2: axial st · axial · 0.80mm/px · z∈[+870,+1260]mm · 13 of 92 slices shown, 15 images]
[im 7/92  soft-tissue]
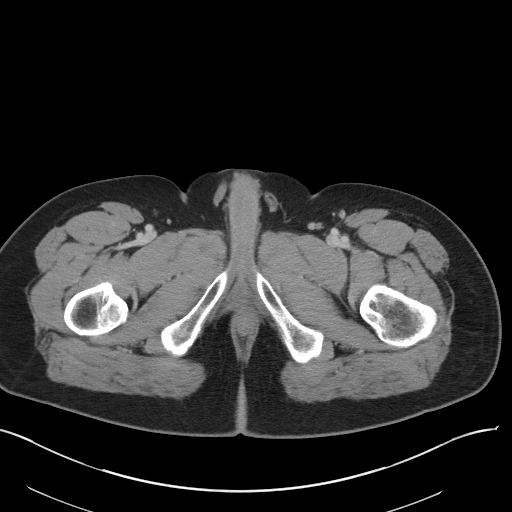
[im 7/92  bone]
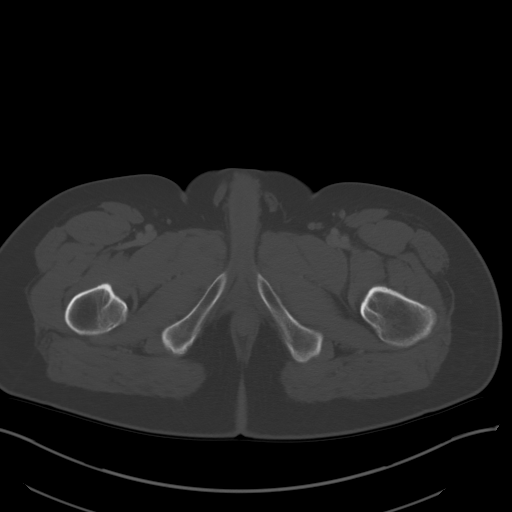
[im 13/92  soft-tissue]
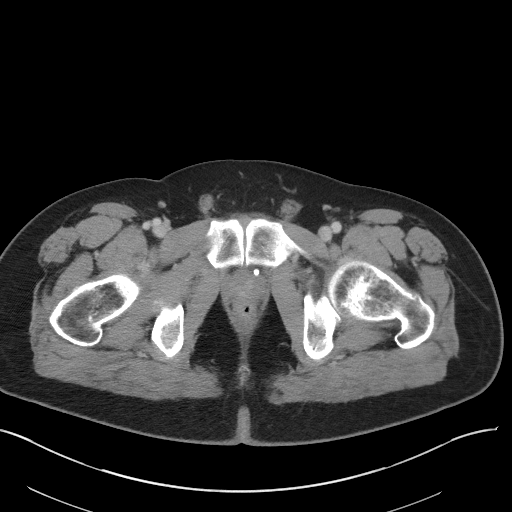
[im 19/92  soft-tissue]
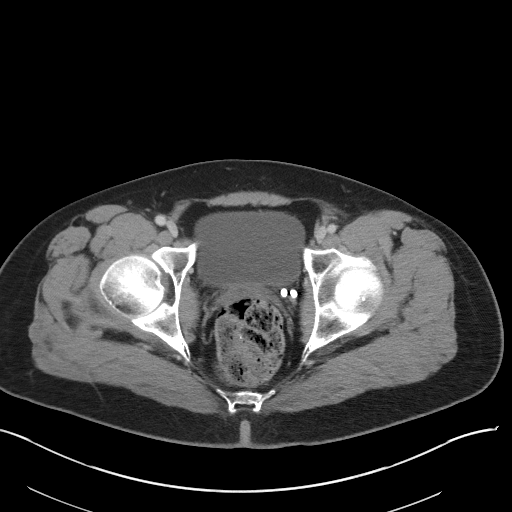
[im 25/92  soft-tissue]
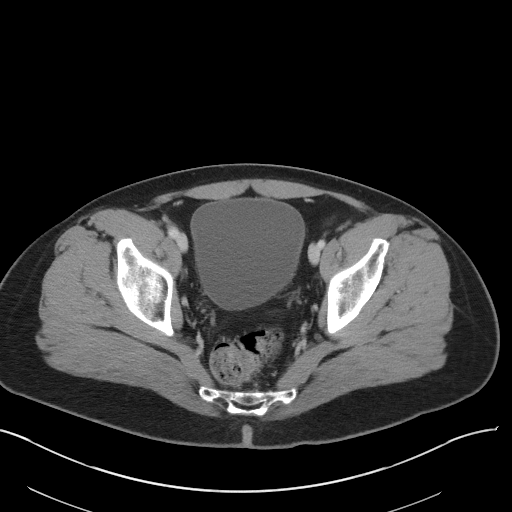
[im 31/92  soft-tissue]
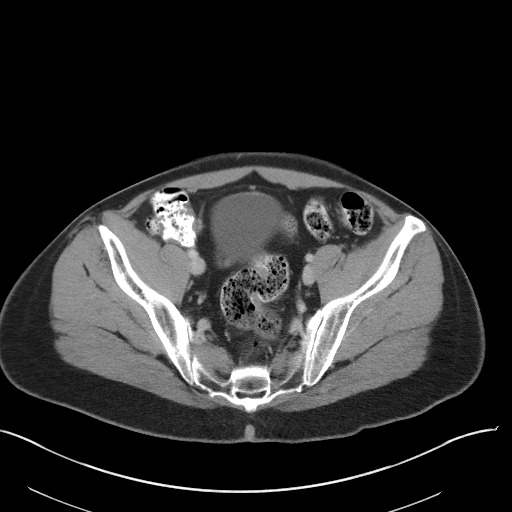
[im 37/92  soft-tissue]
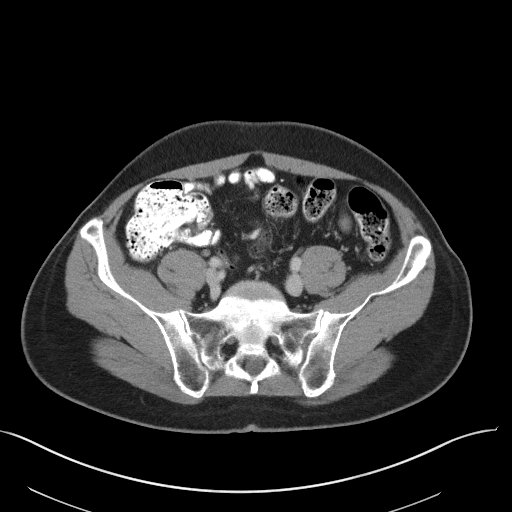
[im 49/92  soft-tissue]
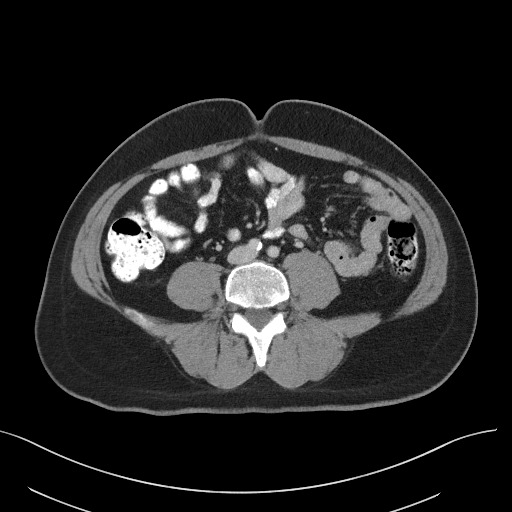
[im 55/92  soft-tissue]
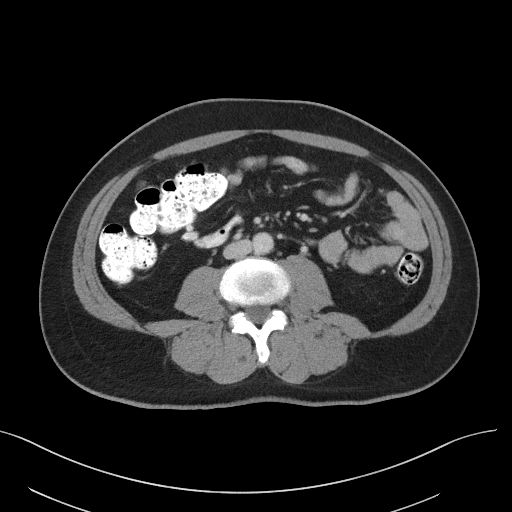
[im 61/92  soft-tissue]
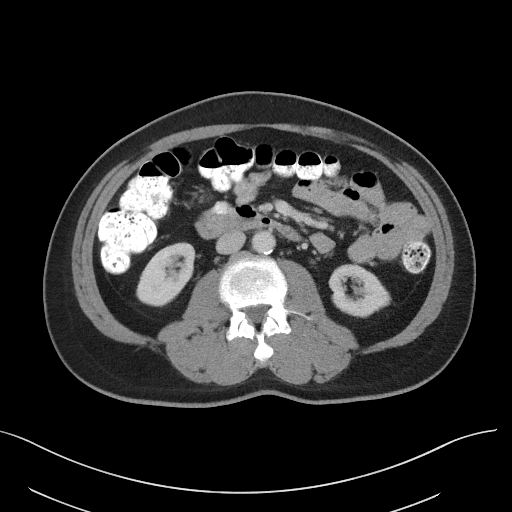
[im 61/92  bone]
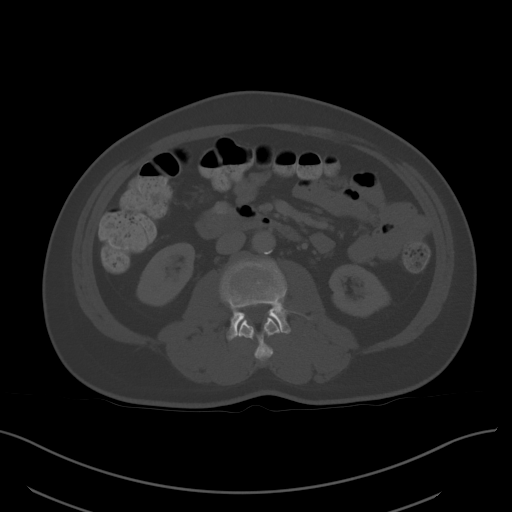
[im 67/92  soft-tissue]
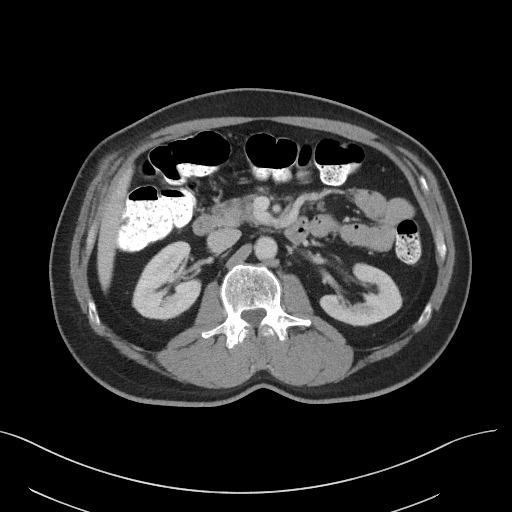
[im 73/92  soft-tissue]
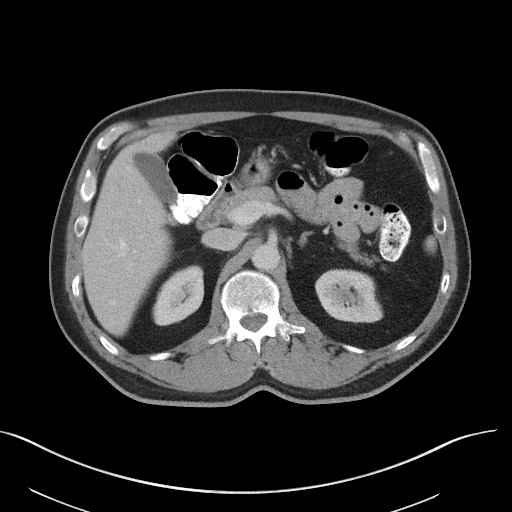
[im 79/92  soft-tissue]
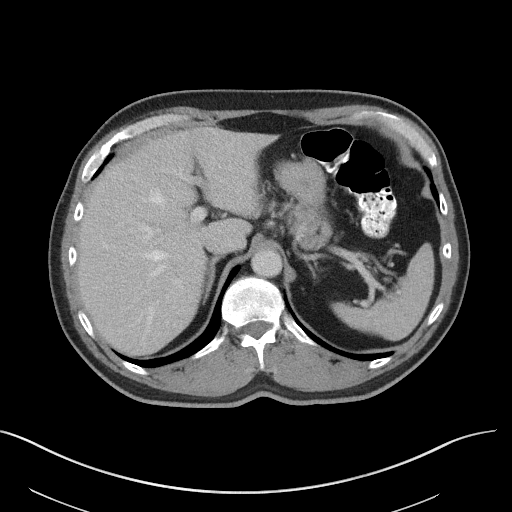
[im 85/92  soft-tissue]
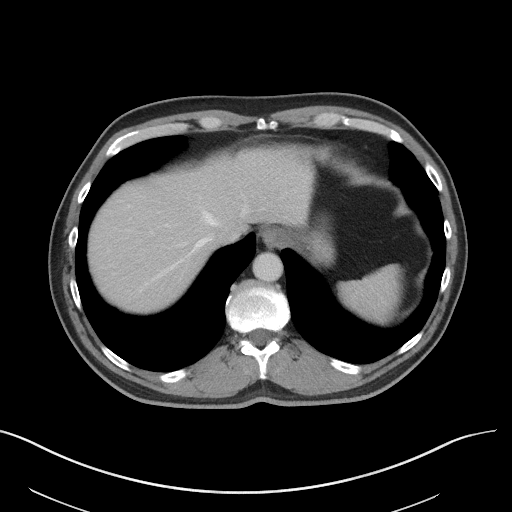

[Series 6: coronal st · coronal · 0.83mm/px · 3 of 102 slices shown]
[im 34/102  soft-tissue]
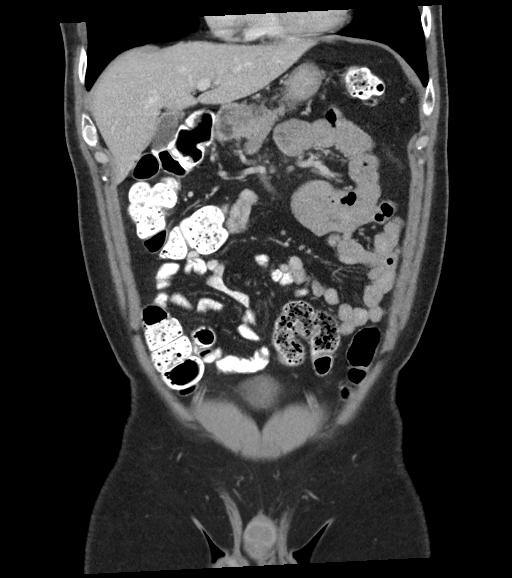
[im 45/102  soft-tissue]
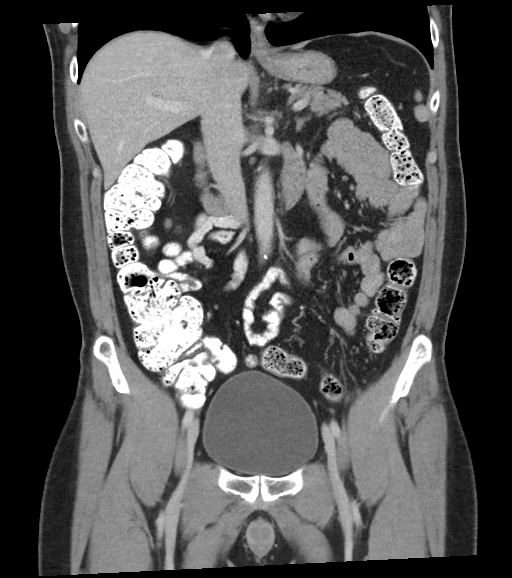
[im 57/102  soft-tissue]
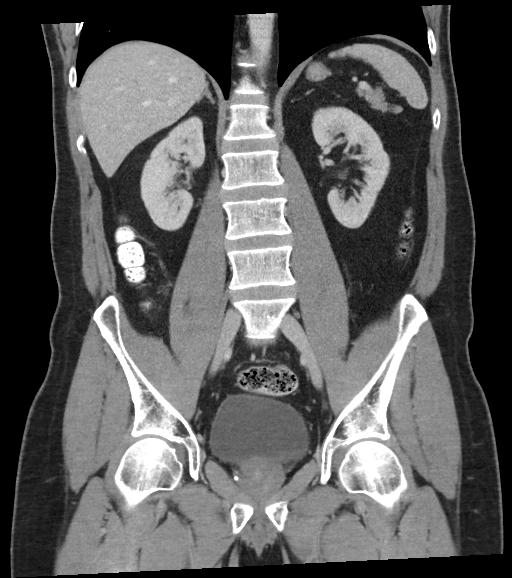

[16 of 46 positions shown; findings below may reference images not displayed]

FINDINGS: Lower chest: Lung bases are clear.

Hepatobiliary: There is a 2-3 mm probable cyst near the dome in the
anterior segment right lobe. No other focal liver lesions are
evident. The gallbladder wall is not appreciably thickened. There is
no biliary duct dilatation.

Pancreas: There is no pancreatic mass or inflammatory focus.

Spleen: No splenic lesions are evident.

Adrenals/Urinary Tract: Adrenals bilaterally appear normal. There is
no evident renal mass or hydronephrosis on either side. There is a 2
mm calculus in the upper pole of the right kidney. There is a 1 mm
calculus in the lower pole of there is no appreciable ureteral
calculus on either side. Urinary bladder is midline with wall
thickness within normal limits. The right kidney.

Stomach/Bowel: Rectum is mildly distended with stool. No rectal wall
thickening or perirectal stranding. There is moderate stool
elsewhere throughout the colon. No appreciable diverticular disease
noted. There is no appreciable bowel wall or mesenteric thickening.
Terminal ileum appears normal. No bowel obstruction. No evident free
air or portal venous air.

Vascular/Lymphatic: There is no abdominal aortic aneurysm. There is
aortic and common iliac artery atherosclerosis. There is no
demonstrable adenopathy in the abdomen or pelvis.

Reproductive: Prostate and seminal vesicles are normal in size and
contour. Minimal prostatic calcifications noted. No pelvic mass
evident.

Other: Appendix appears normal. No evident abscess or ascites in the
abdomen or pelvis. There is slight fat in the umbilicus.

Musculoskeletal: There are no blastic or lytic bone lesions. No
intramuscular lesions are evident.
IMPRESSION: 1. No appreciable diverticular disease. No bowel wall thickening or
bowel obstruction. No abscess in the abdomen or pelvis. Appendix
appears normal.

2. Nonobstructing 1-2 mm calculi in the right kidney. No
hydronephrosis on either side. No ureteral calculi. Urinary bladder
wall thickness normal.

3.  Aortic Atherosclerosis (F7USH-[id number]).

## 2022-09-21 LAB — LAB REPORT - SCANNED: EGFR: 95

## 2023-02-06 ENCOUNTER — Ambulatory Visit: Payer: BC Managed Care – PPO | Admitting: Family Medicine

## 2023-02-06 ENCOUNTER — Encounter: Payer: Self-pay | Admitting: Family Medicine

## 2023-02-06 VITALS — BP 122/77 | HR 101 | Temp 98.5°F | Ht 72.0 in | Wt 182.0 lb

## 2023-02-06 DIAGNOSIS — J302 Other seasonal allergic rhinitis: Secondary | ICD-10-CM | POA: Diagnosis not present

## 2023-02-06 DIAGNOSIS — J019 Acute sinusitis, unspecified: Secondary | ICD-10-CM

## 2023-02-06 DIAGNOSIS — R051 Acute cough: Secondary | ICD-10-CM | POA: Diagnosis not present

## 2023-02-06 DIAGNOSIS — B9689 Other specified bacterial agents as the cause of diseases classified elsewhere: Secondary | ICD-10-CM

## 2023-02-06 MED ORDER — BENZONATATE 100 MG PO CAPS: 100.0000 mg | ORAL_CAPSULE | Freq: Three times a day (TID) | ORAL | 0 refills | Status: DC | PRN

## 2023-02-06 MED ORDER — AMOXICILLIN-POT CLAVULANATE 875-125 MG PO TABS: 1.0000 | ORAL_TABLET | Freq: Two times a day (BID) | ORAL | 0 refills | Status: AC

## 2023-02-06 NOTE — Patient Instructions (Signed)

## 2023-02-06 NOTE — Progress Notes (Signed)
Subjective:  Patient ID: Jose Golden, male    DOB: Mar 04, 1971, 52 y.o.   MRN: [id number]  Patient Care Team: Junie Spencer, FNP as PCP - General (Family Medicine) Jena Gauss Gerrit Friends, MD as Consulting Physician (Gastroenterology)   Chief Complaint:  Cough (Cough/congestion X 1 week)   HPI: Jose Golden is a 52 y.o. male presenting on 02/06/2023 for Cough (Cough/congestion X 1 week)  States that it started last week with a scratchy throat. Reports that he felt he was getting better and then worsened again on Saturday.  Reports cough, drainage, and congestion in his throat  Cough is productive on occasion, greenish and clear  Rhinorrhea, green and clear  States that he had 100 degree temperature over the weekend with ear probe.  Denies sinus pressure, denies ear pain.  Denies sore throat.  Has tried alka seltzer liquid gels. States that it helped him some.  Taking ibuprofen.   Relevant past medical, surgical, family, and social history reviewed and updated as indicated.  Allergies and medications reviewed and updated. Data reviewed: Chart in Epic.   Past Medical History:  Diagnosis Date   Allergy    History of kidney stones    Hyperlipidemia     Past Surgical History:  Procedure Laterality Date   COLONOSCOPY N/A 11/21/2019   Procedure: COLONOSCOPY;  Surgeon: Corbin Ade, MD;  Location: [facility];  Service: Endoscopy;  Laterality: N/A;  1:00pm   LITHOTRIPSY      Social History   Socioeconomic History   Marital status: Married    Spouse name: Not on file   Number of children: Not on file   Years of education: Not on file   Highest education level: Not on file  Occupational History   Occupation: Clerical  Tobacco Use   Smoking status: Never   Smokeless tobacco: Never  Vaping Use   Vaping status: Never Used  Substance and Sexual Activity   Alcohol use: No   Drug use: No   Sexual activity: Yes  Other Topics Concern   Not on file  Social History  Narrative   Not on file   Social Determinants of Health   Financial Resource Strain: Not on file  Food Insecurity: Not on file  Transportation Needs: Not on file  Physical Activity: Not on file  Stress: Not on file  Social Connections: Not on file  Intimate Partner Violence: Not on file    Outpatient Encounter Medications as of 02/06/2023  Medication Sig   albuterol (VENTOLIN HFA) 108 (90 Base) MCG/ACT inhaler Inhale 2 puffs into the lungs every 6 (six) hours as needed for wheezing or shortness of breath.   cetirizine (ZYRTEC) 10 MG tablet Take 10 mg by mouth every evening.    Cholecalciferol (VITAMIN D3 PO) Take 1 tablet by mouth at bedtime.   dexamethasone (DECADRON) 6 MG tablet Take 1 tablet (6 mg total) by mouth 2 (two) times daily.   fluticasone (FLONASE) 50 MCG/ACT nasal spray Place 1-2 sprays into both nostrils daily as needed (congestion.).   hydrocortisone (ANUSOL-HC) 2.5 % rectal cream Place 1 application rectally 2 (two) times daily. (Patient taking differently: Place 1 application  rectally 2 (two) times daily as needed for hemorrhoids.)   ibuprofen (ADVIL) 200 MG tablet Take 200-400 mg by mouth every 8 (eight) hours as needed (pain).   linaclotide (LINZESS) 72 MCG capsule Take 1 capsule (72 mcg total) by mouth daily before breakfast.   Melatonin 10 MG TABS  as directed Orally   Multiple Vitamin (MULTIVITAMIN WITH MINERALS) TABS tablet Take 1 tablet by mouth at bedtime.   pravastatin (PRAVACHOL) 40 MG tablet TAKE 1 TABLET DAILY (Patient taking differently: Take 40 mg by mouth at bedtime.)   No facility-administered encounter medications on file as of 02/06/2023.    Allergies  Allergen Reactions   English Plantain Other (See Comments)    NASAL CONGESTION.    Review of Systems As per HPI  Objective:  BP 122/77   Pulse (!) 101   Temp 98.5 F (36.9 C)   Ht 6' (1.829 m)   Wt 182 lb (82.6 kg)   SpO2 99%   BMI 24.68 kg/m    Wt Readings from Last 3 Encounters:   02/06/23 182 lb (82.6 kg)  11/21/19 180 lb (81.6 kg)  09/08/19 185 lb 3.2 oz (84 kg)   Physical Exam Constitutional:      General: He is awake. He is not in acute distress.    Appearance: Normal appearance. He is well-developed and well-groomed. He is not ill-appearing, toxic-appearing or diaphoretic.  HENT:     Right Ear: No decreased hearing noted. No laceration, drainage, swelling or tenderness. A middle ear effusion is present. There is no impacted cerumen. No foreign body. No mastoid tenderness. No PE tube. No hemotympanum. Tympanic membrane is injected. Tympanic membrane is not scarred, perforated, erythematous, retracted or bulging.     Left Ear: No decreased hearing noted. No laceration, drainage, swelling or tenderness. A middle ear effusion is present. There is no impacted cerumen. No foreign body. No mastoid tenderness. No PE tube. No hemotympanum. Tympanic membrane is injected. Tympanic membrane is not scarred, perforated, erythematous, retracted or bulging.     Nose: Mucosal edema, congestion and rhinorrhea present. No nasal deformity, septal deviation, signs of injury, laceration or nasal tenderness. Rhinorrhea is purulent.     Right Turbinates: Enlarged and swollen.     Left Turbinates: Swollen.     Right Sinus: No maxillary sinus tenderness or frontal sinus tenderness.     Left Sinus: Maxillary sinus tenderness present. No frontal sinus tenderness.     Mouth/Throat:     Lips: Pink. No lesions.     Mouth: Mucous membranes are moist. No injury or oral lesions.     Dentition: Normal dentition.     Tongue: No lesions.     Palate: No mass.     Pharynx: Oropharynx is clear. Posterior oropharyngeal erythema and postnasal drip present. No pharyngeal swelling, oropharyngeal exudate or uvula swelling.     Tonsils: No tonsillar exudate or tonsillar abscesses.  Cardiovascular:     Rate and Rhythm: Normal rate and regular rhythm.     Pulses: Normal pulses.          Radial pulses are  2+ on the right side and 2+ on the left side.       Posterior tibial pulses are 2+ on the right side and 2+ on the left side.     Heart sounds: Normal heart sounds. No murmur heard.    No gallop.  Pulmonary:     Effort: Pulmonary effort is normal. No respiratory distress.     Breath sounds: Normal breath sounds. No stridor. No wheezing, rhonchi or rales.  Musculoskeletal:     Cervical back: Full passive range of motion without pain and neck supple.     Right lower leg: No edema.     Left lower leg: No edema.  Lymphadenopathy:     Head:  Right side of head: No submental, submandibular, tonsillar, preauricular or posterior auricular adenopathy.     Left side of head: Tonsillar adenopathy present. No submental, submandibular, preauricular or posterior auricular adenopathy.  Skin:    General: Skin is warm.     Capillary Refill: Capillary refill takes less than 2 seconds.  Neurological:     General: No focal deficit present.     Mental Status: He is alert, oriented to person, place, and time and easily aroused. Mental status is at baseline.     GCS: GCS eye subscore is 4. GCS verbal subscore is 5. GCS motor subscore is 6.     Motor: No weakness.  Psychiatric:        Attention and Perception: Attention and perception normal.        Mood and Affect: Mood and affect normal.        Speech: Speech normal.        Behavior: Behavior normal. Behavior is cooperative.        Thought Content: Thought content normal. Thought content does not include homicidal or suicidal ideation. Thought content does not include homicidal or suicidal plan.        Cognition and Memory: Cognition and memory normal.        Judgment: Judgment normal.      Results for orders placed or performed in visit on 10/06/22  Lab report - scanned  Result Value Ref Range   EGFR 95.0       02/06/2023    3:09 PM 04/21/2020    2:58 PM 01/27/2019   11:57 AM 04/25/2018   11:21 AM 03/14/2018    3:29 PM  Depression screen  PHQ 2/9  Decreased Interest 0 0 0 0 0  Down, Depressed, Hopeless 0 0 0 0 0  PHQ - 2 Score 0 0 0 0 0  Altered sleeping 0      Tired, decreased energy 1      Change in appetite 0      Feeling bad or failure about yourself  0      Trouble concentrating 0      Moving slowly or fidgety/restless 0      Suicidal thoughts 0      PHQ-9 Score 1      Difficult doing work/chores Not difficult at all           02/06/2023    3:09 PM  GAD 7 : Generalized Anxiety Score  Nervous, Anxious, on Edge 0  Control/stop worrying 0  Worry too much - different things 0  Trouble relaxing 0  Restless 0  Easily annoyed or irritable 0  Afraid - awful might happen 0  Total GAD 7 Score 0  Anxiety Difficulty Not difficult at all      Pertinent labs & imaging results that were available during my care of the patient were reviewed by me and considered in my medical decision making.  Assessment & Plan:  Cane was seen today for cough.  Diagnoses and all orders for this visit:  Acute bacterial rhinosinusitis Medication as below. Recommend patient continue supportive treatment at home.  -     amoxicillin-clavulanate (AUGMENTIN) 875-125 MG tablet; Take 1 tablet by mouth 2 (two) times daily for 5 days. -     benzonatate (TESSALON PERLES) 100 MG capsule; Take 1 capsule (100 mg total) by mouth 3 (three) times daily as needed for cough.  Acute cough Medication as below. Recommend patient continue supportive treatment at home.  -  benzonatate (TESSALON PERLES) 100 MG capsule; Take 1 capsule (100 mg total) by mouth 3 (three) times daily as needed for cough.  Seasonal allergies Given history of seasonal allergies. Instructed patient to continue flonase and trial switching antihistamine.   Continue all other maintenance medications.  Follow up plan: Return if symptoms worsen or fail to improve.   Continue healthy lifestyle choices, including diet (rich in fruits, vegetables, and lean proteins, and low in  salt and simple carbohydrates) and exercise (at least 30 minutes of moderate physical activity daily).  Written and verbal instructions provided   The above assessment and management plan was discussed with the patient. The patient verbalized understanding of and has agreed to the management plan. Patient is aware to call the clinic if they develop any new symptoms or if symptoms persist or worsen. Patient is aware when to return to the clinic for a follow-up visit. Patient educated on when it is appropriate to go to the emergency department.   Neale Burly, DNP-FNP [facility] [address] [place], [place] [address] [phone]

## 2024-05-20 ENCOUNTER — Encounter: Payer: Self-pay | Admitting: Family

## 2024-05-20 ENCOUNTER — Ambulatory Visit (INDEPENDENT_AMBULATORY_CARE_PROVIDER_SITE_OTHER): Admitting: Family

## 2024-05-20 VITALS — BP 139/87 | HR 78 | Temp 97.4°F | Ht 72.0 in | Wt 188.2 lb

## 2024-05-20 DIAGNOSIS — B351 Tinea unguium: Secondary | ICD-10-CM | POA: Diagnosis not present

## 2024-05-20 DIAGNOSIS — J301 Allergic rhinitis due to pollen: Secondary | ICD-10-CM

## 2024-05-20 DIAGNOSIS — Z1159 Encounter for screening for other viral diseases: Secondary | ICD-10-CM | POA: Diagnosis not present

## 2024-05-20 DIAGNOSIS — E785 Hyperlipidemia, unspecified: Secondary | ICD-10-CM

## 2024-05-20 DIAGNOSIS — E663 Overweight: Secondary | ICD-10-CM | POA: Diagnosis not present

## 2024-05-20 DIAGNOSIS — Z0000 Encounter for general adult medical examination without abnormal findings: Secondary | ICD-10-CM

## 2024-05-20 MED ORDER — PRAVASTATIN SODIUM 40 MG PO TABS: 40.0000 mg | ORAL_TABLET | Freq: Every day | ORAL | 4 refills | Status: AC

## 2024-05-20 MED ORDER — TERBINAFINE HCL 250 MG PO TABS: 250.0000 mg | ORAL_TABLET | Freq: Every day | ORAL | 0 refills | Status: AC

## 2024-05-20 NOTE — Progress Notes (Signed)
 "  Subjective:    Patient ID: Jose Golden, male    DOB: 1970/09/15, 54 y.o.   MRN: [id number]  Chief Complaint  Patient presents with   Annual Exam    Right Big toe problem.   Pt presents to the office today for CPE.   Complaining of right great toe nail thick and discolored.   He has seasonal allergies and takes zyrtec 10 mg daily.  Hyperlipidemia This is a chronic problem. The current episode started more than 1 year ago. The problem is controlled. Recent lipid tests were reviewed and are normal. Current antihyperlipidemic treatment includes statins. The current treatment provides moderate improvement of lipids. Risk factors for coronary artery disease include dyslipidemia, male sex, hypertension, a sedentary lifestyle and post-menopausal.      Review of Systems  All other systems reviewed and are negative.   Family History  Problem Relation Age of Onset   Heart disease Mother    Cancer Mother        kidney   Heart disease Father        CABG   Seizures Father    Colon cancer Neg Hx     Social History   Socioeconomic History   Marital status: Married    Spouse name: Not on file   Number of children: Not on file   Years of education: Not on file   Highest education level: 12th grade  Occupational History   Occupation: Warehouse Manager  Tobacco Use   Smoking status: Never   Smokeless tobacco: Never  Vaping Use   Vaping status: Never Used  Substance and Sexual Activity   Alcohol use: No   Drug use: No   Sexual activity: Yes  Other Topics Concern   Not on file  Social History Narrative   Not on file   Social Drivers of Health   Tobacco Use: Low Risk (05/20/2024)   Patient History    Smoking Tobacco Use: Never    Smokeless Tobacco Use: Never    Passive Exposure: Not on file  Financial Resource Strain: Low Risk (05/19/2024)   Overall Financial Resource Strain (CARDIA)    Difficulty of Paying Living Expenses: Not hard at all  Food Insecurity: No Food Insecurity  (05/19/2024)   Epic    Worried About Programme Researcher, Broadcasting/film/video in the Last Year: Never true    Ran Out of Food in the Last Year: Never true  Transportation Needs: No Transportation Needs (05/19/2024)   Epic    Lack of Transportation (Medical): No    Lack of Transportation (Non-Medical): No  Physical Activity: Unknown (05/19/2024)   Exercise Vital Sign    Days of Exercise per Week: Patient declined    Minutes of Exercise per Session: Not on file  Stress: No Stress Concern Present (05/19/2024)   [employer] of Occupational Health - Occupational Stress Questionnaire    Feeling of Stress: Not at all  Social Connections: Unknown (05/19/2024)   Social Connection and Isolation Panel    Frequency of Communication with Friends and Family: More than three times a week    Frequency of Social Gatherings with Friends and Family: More than three times a week    Attends Religious Services: More than 4 times per year    Active Member of Clubs or Organizations: Patient declined    Attends Banker Meetings: Not on file    Marital Status: Married  Depression (PHQ2-9): Low Risk (05/20/2024)   Depression (PHQ2-9)    PHQ-2 Score:  0  Alcohol Screen: Not on file  Housing: Low Risk (05/19/2024)   Epic    Unable to Pay for Housing in the Last Year: No    Number of Times Moved in the Last Year: 0    Homeless in the Last Year: No  Utilities: Not on file  Health Literacy: Not on file       Objective:   Physical Exam Vitals reviewed.  Constitutional:      General: He is not in acute distress.    Appearance: He is well-developed.  HENT:     Head: Normocephalic.     Right Ear: Tympanic membrane normal.     Left Ear: Tympanic membrane normal.  Eyes:     General:        Right eye: No discharge.        Left eye: No discharge.     Pupils: Pupils are equal, round, and reactive to light.  Neck:     Thyroid : No thyromegaly.  Cardiovascular:     Rate and Rhythm: Normal rate and regular rhythm.      Heart sounds: Normal heart sounds. No murmur heard. Pulmonary:     Effort: Pulmonary effort is normal. No respiratory distress.     Breath sounds: Normal breath sounds. No wheezing.  Abdominal:     General: Bowel sounds are normal. There is no distension.     Palpations: Abdomen is soft.     Tenderness: There is no abdominal tenderness.  Genitourinary:    Rectum: External hemorrhoid present.  Musculoskeletal:        General: No tenderness. Normal range of motion.     Cervical back: Normal range of motion and neck supple.  Skin:    General: Skin is warm and dry.     Findings: No erythema or rash.     Comments: Right great toenail thick and discolored  Neurological:     Mental Status: He is alert and oriented to person, place, and time.     Cranial Nerves: No cranial nerve deficit.     Deep Tendon Reflexes: Reflexes are normal and symmetric.  Psychiatric:        Behavior: Behavior normal.        Thought Content: Thought content normal.        Judgment: Judgment normal.       BP 139/87   Pulse 78   Temp (!) 97.4 F (36.3 C) (Temporal)   Ht 6' (1.829 m)   Wt 188 lb 3.2 oz (85.4 kg)   SpO2 97%   BMI 25.52 kg/m      Assessment & Plan:  Jose Golden comes in today with chief complaint of Annual Exam (Right Big toe problem.)   Diagnosis and orders addressed:  1. Annual physical exam (Primary) - CMP14+EGFR - CBC with Differential/Platelet - Lipid panel - PSA, total and free - Hepatitis B surface antibody,quantitative  2. Overweight (BMI 25.0-29.9) - CMP14+EGFR - CBC with Differential/Platelet  3. Hyperlipidemia, unspecified hyperlipidemia type - CMP14+EGFR - CBC with Differential/Platelet - pravastatin  (PRAVACHOL ) 40 MG tablet; Take 1 tablet (40 mg total) by mouth daily.  Dispense: 90 tablet; Refill: 4  4. Seasonal allergic rhinitis due to pollen - CMP14+EGFR - CBC with Differential/Platelet  5. Onychomycosis Start terbinafine  daily for next 3  months Avoid walking barefoot - CMP14+EGFR - CBC with Differential/Platelet - terbinafine  (LAMISIL ) 250 MG tablet; Take 1 tablet (250 mg total) by mouth daily.  Dispense: 90 tablet; Refill: 0  6.  Need for hepatitis B screening test - CMP14+EGFR - CBC with Differential/Platelet - Hepatitis B surface antibody,quantitative   Labs pending Continue current medications  Health Maintenance reviewed- Refuses all vaccines today Diet and exercise encouraged  Follow up plan: 1 year   Bari Learn, FNP  "

## 2024-05-20 NOTE — Patient Instructions (Signed)
Onychomycosis/Fungal Toenails  WHAT IS IT? An infection that lies within the keratin of your nail plate that is caused by a fungus.  WHY ME? Fungal infections affect all ages, sexes, races, and creeds.  There may be many factors that predispose you to a fungal infection such as age, coexisting medical conditions such as diabetes, or an autoimmune disease; stress, medications, fatigue, genetics, etc.  Bottom line: fungus thrives in a warm, moist environment and your shoes offer such a location.  IS IT CONTAGIOUS? Theoretically, yes.  You do not want to share shoes, nail clippers or files with someone who has fungal toenails.  Walking around barefoot in the same room or sleeping in the same bed is unlikely to transfer the organism.  It is important to realize, however, that fungus can spread easily from one nail to the next on the same foot.  HOW DO WE TREAT THIS?  There are several ways to treat this condition.  Treatment may depend on many factors such as age, medications, pregnancy, liver and kidney conditions, etc.  It is best to ask your doctor which options are available to you.  No treatment.   Unlike many other medical concerns, you can live with this condition.  However for many people this can be a painful condition and may lead to ingrown toenails or a bacterial infection.  It is recommended that you keep the nails cut short to help reduce the amount of fungal nail. Topical treatment.  These range from herbal remedies to prescription strength nail lacquers.  About 40-50% effective, topicals require twice daily application for approximately 9 to 12 months or until an entirely new nail has grown out.  The most effective topicals are medical grade medications available through physicians offices. Oral antifungal medications.  With an 80-90% cure rate, the most common oral medication requires 3 to 4 months of therapy and stays in your system for a year as the new nail grows out.  Oral antifungal  medications do require blood work to make sure it is a safe drug for you.  A liver function panel will be performed prior to starting the medication and after the first month of treatment.  It is important to have the blood work performed to avoid any harmful side effects.  In general, this medication safe but blood work is required. Laser Therapy.  This treatment is performed by applying a specialized laser to the affected nail plate.  This therapy is noninvasive, fast, and non-painful.  It is not covered by insurance and is therefore, out of pocket.  The results have been very good with a 80-95% cure rate.  The [facility] is the only practice in the area to offer this therapy. Permanent Nail Avulsion.  Removing the entire nail so that a new nail will not grow back. 

## 2024-05-21 LAB — LIPID PANEL
Chol/HDL Ratio: 2.9 ratio (ref 0.0–5.0)
Cholesterol, Total: 161 mg/dL (ref 100–199)
HDL: 56 mg/dL
LDL Chol Calc (NIH): 94 mg/dL (ref 0–99)
Triglycerides: 54 mg/dL (ref 0–149)
VLDL Cholesterol Cal: 11 mg/dL (ref 5–40)

## 2024-05-21 LAB — CBC WITH DIFFERENTIAL/PLATELET
Basophils Absolute: 0 x10E3/uL (ref 0.0–0.2)
Basos: 1 %
EOS (ABSOLUTE): 0.3 x10E3/uL (ref 0.0–0.4)
Eos: 6 %
Hematocrit: 44.7 % (ref 37.5–51.0)
Hemoglobin: 14.6 g/dL (ref 13.0–17.7)
Immature Grans (Abs): 0 x10E3/uL (ref 0.0–0.1)
Immature Granulocytes: 0 %
Lymphocytes Absolute: 1.5 x10E3/uL (ref 0.7–3.1)
Lymphs: 35 %
MCH: 29.4 pg (ref 26.6–33.0)
MCHC: 32.7 g/dL (ref 31.5–35.7)
MCV: 90 fL (ref 79–97)
Monocytes Absolute: 0.5 x10E3/uL (ref 0.1–0.9)
Monocytes: 13 %
Neutrophils Absolute: 2 x10E3/uL (ref 1.4–7.0)
Neutrophils: 45 %
Platelets: 248 x10E3/uL (ref 150–450)
RBC: 4.97 x10E6/uL (ref 4.14–5.80)
RDW: 12.5 % (ref 11.6–15.4)
WBC: 4.3 x10E3/uL (ref 3.4–10.8)

## 2024-05-21 LAB — CMP14+EGFR
ALT: 32 IU/L (ref 0–44)
AST: 31 IU/L (ref 0–40)
Albumin: 4.5 g/dL (ref 3.8–4.9)
Alkaline Phosphatase: 53 IU/L (ref 47–123)
BUN/Creatinine Ratio: 13 (ref 9–20)
BUN: 12 mg/dL (ref 6–24)
Bilirubin Total: 1.2 mg/dL (ref 0.0–1.2)
CO2: 21 mmol/L (ref 20–29)
Calcium: 9.6 mg/dL (ref 8.7–10.2)
Chloride: 105 mmol/L (ref 96–106)
Creatinine, Ser: 0.94 mg/dL (ref 0.76–1.27)
Globulin, Total: 2.3 g/dL (ref 1.5–4.5)
Glucose: 96 mg/dL (ref 70–99)
Potassium: 4.1 mmol/L (ref 3.5–5.2)
Sodium: 140 mmol/L (ref 134–144)
Total Protein: 6.8 g/dL (ref 6.0–8.5)
eGFR: 96 mL/min/1.73

## 2024-05-21 LAB — PSA, TOTAL AND FREE
PSA, Free Pct: 14.3 %
PSA, Free: 0.2 ng/mL
Prostate Specific Ag, Serum: 1.4 ng/mL (ref 0.0–4.0)

## 2024-05-21 LAB — HEPATITIS B SURFACE ANTIBODY, QUANTITATIVE: Hepatitis B Surf Ab Quant: 77.7 m[IU]/mL

## 2024-05-22 ENCOUNTER — Ambulatory Visit: Payer: Self-pay | Admitting: Family

## 2025-05-21 ENCOUNTER — Encounter: Admitting: Family
